# Patient Record
Sex: Male | Born: 1970 | Race: White | Hispanic: No | Marital: Single | Smoking: Never smoker
Health system: Southern US, Community
[De-identification: ages and names within clinical notes are randomized; demographics above are authoritative.]

## PROBLEM LIST (undated history)

## (undated) DIAGNOSIS — L409 Psoriasis, unspecified: Secondary | ICD-10-CM

---

## 2009-10-23 ENCOUNTER — Emergency Department: Payer: Self-pay | Admitting: Emergency Medicine

## 2014-04-16 ENCOUNTER — Ambulatory Visit: Payer: Self-pay | Admitting: Urology

## 2014-04-16 LAB — BASIC METABOLIC PANEL
ANION GAP: 7 (ref 7–16)
BUN: 12 mg/dL (ref 7–18)
CALCIUM: 8.9 mg/dL (ref 8.5–10.1)
Chloride: 101 mmol/L (ref 98–107)
Co2: 27 mmol/L (ref 21–32)
Creatinine: 1.18 mg/dL (ref 0.60–1.30)
EGFR (Non-African Amer.): >60
Glucose: 101 mg/dL — ABNORMAL HIGH (ref 65–99)
Osmolality: 270 (ref 275–301)
POTASSIUM: 4 mmol/L (ref 3.5–5.1)
Sodium: 135 mmol/L — ABNORMAL LOW (ref 136–145)

## 2014-04-16 LAB — CBC
HCT: 49.4 % (ref 40.0–52.0)
HGB: 16.3 g/dL (ref 13.0–18.0)
MCH: 30.8 pg (ref 26.0–34.0)
MCHC: 33 g/dL (ref 32.0–36.0)
MCV: 93 fL (ref 80–100)
Platelet: 144 10*3/uL — ABNORMAL LOW (ref 150–440)
RBC: 5.3 10*6/uL (ref 4.40–5.90)
RDW: 13 % (ref 11.5–14.5)
WBC: 11 10*3/uL — AB (ref 3.8–10.6)

## 2014-04-16 LAB — URINALYSIS, COMPLETE
Bilirubin,UR: NEGATIVE
Blood: NEGATIVE
GLUCOSE, UR: NEGATIVE mg/dL (ref 0–75)
Leukocyte Esterase: NEGATIVE
Nitrite: NEGATIVE
PH: 5 (ref 4.5–8.0)
Protein: 100
RBC,UR: 4 /HPF (ref 0–5)
Specific Gravity: 1.028 (ref 1.003–1.030)
Squamous Epithelial: 1

## 2014-04-16 LAB — HEMOGLOBIN: HGB: 15.1 g/dL (ref 13.0–18.0)

## 2014-04-16 LAB — CK: CK, TOTAL: 145 U/L

## 2014-04-17 LAB — APTT: Activated PTT: 34 secs (ref 23.6–35.9)

## 2014-04-17 LAB — HEMATOCRIT
HCT: 43.3 % (ref 40.0–52.0)
HCT: 43.6 % (ref 40.0–52.0)

## 2014-04-17 LAB — BASIC METABOLIC PANEL
Anion Gap: 4 — ABNORMAL LOW (ref 7–16)
BUN: 14 mg/dL (ref 7–18)
CALCIUM: 8.5 mg/dL (ref 8.5–10.1)
CREATININE: 1.26 mg/dL (ref 0.60–1.30)
Chloride: 104 mmol/L (ref 98–107)
Co2: 27 mmol/L (ref 21–32)
Glucose: 111 mg/dL — ABNORMAL HIGH (ref 65–99)
OSMOLALITY: 271 (ref 275–301)
POTASSIUM: 3.7 mmol/L (ref 3.5–5.1)
Sodium: 135 mmol/L — ABNORMAL LOW (ref 136–145)

## 2014-04-17 LAB — PROTIME-INR
INR: 1.1
Prothrombin Time: 13.8 secs (ref 11.5–14.7)

## 2014-04-17 LAB — CBC WITH DIFFERENTIAL/PLATELET
BASOS ABS: 0 10*3/uL (ref 0.0–0.1)
BASOS PCT: 0.3 %
Eosinophil #: 0.1 10*3/uL (ref 0.0–0.7)
Eosinophil %: 0.9 %
HCT: 43.5 % (ref 40.0–52.0)
HGB: 14.7 g/dL (ref 13.0–18.0)
LYMPHS ABS: 0.9 10*3/uL — AB (ref 1.0–3.6)
Lymphocyte %: 9.6 %
MCH: 31.1 pg (ref 26.0–34.0)
MCHC: 33.8 g/dL (ref 32.0–36.0)
MCV: 92 fL (ref 80–100)
Monocyte #: 1.1 x10 3/mm — ABNORMAL HIGH (ref 0.2–1.0)
Monocyte %: 11.4 %
NEUTROS ABS: 7.5 10*3/uL — AB (ref 1.4–6.5)
Neutrophil %: 77.8 %
Platelet: 126 10*3/uL — ABNORMAL LOW (ref 150–440)
RBC: 4.72 10*6/uL (ref 4.40–5.90)
RDW: 12.8 % (ref 11.5–14.5)
WBC: 9.7 10*3/uL (ref 3.8–10.6)

## 2014-04-17 LAB — PLATELET COUNT: Platelet: 133 10*3/uL — ABNORMAL LOW (ref 150–440)

## 2014-04-18 LAB — PLATELET FUNCTION ASSAY
COL/ADP PLT FXN SCRN: 138 Seconds — ABNORMAL HIGH (ref 0–100)
COL/ADP PLT FXN SCRN: 112 Seconds — ABNORMAL HIGH (ref 0–100)
COL/EPI PLT FXN SCRN: 108 s
COL/EPI PLT FXN SCRN: 101 Seconds

## 2014-04-18 LAB — CBC WITH DIFFERENTIAL/PLATELET
BASOS PCT: 0.7 %
Basophil #: 0.1 10*3/uL (ref 0.0–0.1)
EOS ABS: 0.1 10*3/uL (ref 0.0–0.7)
Eosinophil %: 0.7 %
HCT: 41.4 % (ref 40.0–52.0)
HGB: 14.3 g/dL (ref 13.0–18.0)
LYMPHS ABS: 0.9 10*3/uL — AB (ref 1.0–3.6)
Lymphocyte %: 8.9 %
MCH: 31.4 pg (ref 26.0–34.0)
MCHC: 34.5 g/dL (ref 32.0–36.0)
MCV: 91 fL (ref 80–100)
MONOS PCT: 11.1 %
Monocyte #: 1.1 x10 3/mm — ABNORMAL HIGH (ref 0.2–1.0)
NEUTROS ABS: 7.5 10*3/uL — AB (ref 1.4–6.5)
NEUTROS PCT: 78.6 %
PLATELETS: 144 10*3/uL — AB (ref 150–440)
RBC: 4.55 10*6/uL (ref 4.40–5.90)
RDW: 12.8 % (ref 11.5–14.5)
WBC: 9.6 10*3/uL (ref 3.8–10.6)

## 2014-04-18 LAB — CREATININE, SERUM
Creatinine: 1.45 mg/dL — ABNORMAL HIGH (ref 0.60–1.30)
Creatinine: 1.16 mg/dL (ref 0.60–1.30)
EGFR (African American): >60
EGFR (Non-African Amer.): 59 — ABNORMAL LOW
EGFR (Non-African Amer.): >60

## 2014-04-18 LAB — HEMATOCRIT
HCT: 40 % (ref 40.0–52.0)
HCT: 37.7 % — ABNORMAL LOW (ref 40.0–52.0)

## 2014-04-18 LAB — RAPID HIV SCREEN (HIV 1/2 AB+AG)

## 2014-04-18 LAB — PLATELET COUNT: Platelet: 135 10*3/uL — ABNORMAL LOW (ref 150–440)

## 2014-04-18 LAB — APTT: ACTIVATED PTT: 37.1 s — AB (ref 23.6–35.9)

## 2014-04-19 ENCOUNTER — Inpatient Hospital Stay: Payer: Self-pay | Admitting: Internal Medicine

## 2014-04-19 ENCOUNTER — Encounter: Payer: Self-pay | Admitting: Internal Medicine

## 2014-04-19 ENCOUNTER — Inpatient Hospital Stay (HOSPITAL_COMMUNITY)
Admission: RE | Admit: 2014-04-19 | Payer: Self-pay | Source: Other Acute Inpatient Hospital | Admitting: Internal Medicine

## 2014-04-19 LAB — CBC WITH DIFFERENTIAL/PLATELET
BASOS ABS: 0.1 10*3/uL (ref 0.0–0.1)
BASOS ABS: 0 10*3/uL (ref 0.0–0.1)
BASOS PCT: 0.8 %
Basophil %: 0.5 %
EOS ABS: 0.1 10*3/uL (ref 0.0–0.7)
EOS PCT: 2.3 %
Eosinophil #: 0.2 10*3/uL (ref 0.0–0.7)
Eosinophil %: 0.8 %
HCT: 36.5 % — ABNORMAL LOW (ref 40.0–52.0)
HCT: 36.2 % — ABNORMAL LOW (ref 40.0–52.0)
HGB: 12.6 g/dL — ABNORMAL LOW (ref 13.0–18.0)
HGB: 12.5 g/dL — ABNORMAL LOW (ref 13.0–18.0)
LYMPHS ABS: 1.1 10*3/uL (ref 1.0–3.6)
LYMPHS PCT: 4.5 %
LYMPHS PCT: 15.5 %
Lymphocyte #: 0.4 10*3/uL — ABNORMAL LOW (ref 1.0–3.6)
MCH: 31.8 pg (ref 26.0–34.0)
MCH: 31.6 pg (ref 26.0–34.0)
MCHC: 34.6 g/dL (ref 32.0–36.0)
MCHC: 34.6 g/dL (ref 32.0–36.0)
MCV: 92 fL (ref 80–100)
MCV: 91 fL (ref 80–100)
MONO ABS: 0.6 x10 3/mm (ref 0.2–1.0)
MONO ABS: 0.7 x10 3/mm (ref 0.2–1.0)
Monocyte %: 7.8 %
Monocyte %: 10.4 %
NEUTROS PCT: 86.1 %
Neutrophil #: 7.1 10*3/uL — ABNORMAL HIGH (ref 1.4–6.5)
Neutrophil #: 4.9 10*3/uL (ref 1.4–6.5)
Neutrophil %: 71.3 %
Platelet: 132 10*3/uL — ABNORMAL LOW (ref 150–440)
Platelet: 140 10*3/uL — ABNORMAL LOW (ref 150–440)
RBC: 3.97 10*6/uL — ABNORMAL LOW (ref 4.40–5.90)
RBC: 3.97 10*6/uL — ABNORMAL LOW (ref 4.40–5.90)
RDW: 12.9 % (ref 11.5–14.5)
RDW: 12.7 % (ref 11.5–14.5)
WBC: 8.3 10*3/uL (ref 3.8–10.6)
WBC: 6.9 10*3/uL (ref 3.8–10.6)

## 2014-04-19 LAB — CREATININE, SERUM
CREATININE: 0.95 mg/dL (ref 0.60–1.30)
Creatinine: 1.01 mg/dL (ref 0.60–1.30)
EGFR (African American): >60
EGFR (Non-African Amer.): >60

## 2014-04-19 LAB — PROTIME-INR
INR: 1.2
INR: 1.1
Prothrombin Time: 14.9 secs — ABNORMAL HIGH (ref 11.5–14.7)
Prothrombin Time: 13.7 secs (ref 11.5–14.7)

## 2014-04-19 LAB — BASIC METABOLIC PANEL
Anion Gap: 5 — ABNORMAL LOW (ref 7–16)
BUN: 13 mg/dL (ref 7–18)
CALCIUM: 8.4 mg/dL — AB (ref 8.5–10.1)
CHLORIDE: 99 mmol/L (ref 98–107)
CO2: 29 mmol/L (ref 21–32)
CREATININE: 1.2 mg/dL (ref 0.60–1.30)
EGFR (African American): >60
EGFR (Non-African Amer.): >60
Glucose: 116 mg/dL — ABNORMAL HIGH (ref 65–99)
OSMOLALITY: 267 (ref 275–301)
Potassium: 4 mmol/L (ref 3.5–5.1)
SODIUM: 133 mmol/L — AB (ref 136–145)

## 2014-04-19 LAB — APTT
ACTIVATED PTT: 36.6 s — AB (ref 23.6–35.9)
ACTIVATED PTT: 33.2 s (ref 23.6–35.9)
Activated PTT: 39.4 secs — ABNORMAL HIGH (ref 23.6–35.9)
Activated PTT: 43.4 secs — ABNORMAL HIGH (ref 23.6–35.9)

## 2014-04-19 LAB — PLATELET FUNCTION ASSAY
COL/ADP PLT FXN SCRN: 170 s — AB (ref 0–100)
COL/EPI PLT FXN SCRN: 170 Seconds — ABNORMAL HIGH

## 2014-04-19 LAB — HEMOGLOBIN
HGB: 12.2 g/dL — AB (ref 13.0–18.0)
HGB: 12.3 g/dL — AB (ref 13.0–18.0)

## 2014-04-19 NOTE — Progress Notes (Unsigned)
Patient ID: Joe FlurryKevin D Oliver, male   DOB: [birth year], 43 y.o.   MRN: [id number]  Request from [facility] Dr. Allena KatzPatel [facility] Pt with renal cyst, hematoma, persistent spontaneous bleeding. Hematologist there consulted and thought pt has VW disease. Pt give one dose of DDAVP, Repeat CT abd with worsening, expanding hematoma. Hematologist there [facility] and close observation. Pt's Hg 12.5 this AM.   Debbora PrestoMAGICK-Janet Humphreys, MD  [facility] Pager [phone] Cell [phone]  If 7PM-7AM, please contact night-coverage [email or web address] Password [email or web address]

## 2014-04-20 ENCOUNTER — Encounter (HOSPITAL_COMMUNITY): Payer: Self-pay | Admitting: *Deleted

## 2014-04-20 ENCOUNTER — Inpatient Hospital Stay (HOSPITAL_COMMUNITY)
Admission: RE | Admit: 2014-04-20 | Discharge: 2014-04-22 | DRG: 395 | Disposition: A | Discharge status: Home / Self Care | Payer: 59 | Source: Other Acute Inpatient Hospital | Attending: Internal Medicine | Admitting: Internal Medicine

## 2014-04-20 DIAGNOSIS — Z8249 Family history of ischemic heart disease and other diseases of the circulatory system: Secondary | ICD-10-CM | POA: Diagnosis not present

## 2014-04-20 DIAGNOSIS — Z79899 Other long term (current) drug therapy: Secondary | ICD-10-CM

## 2014-04-20 DIAGNOSIS — K661 Hemoperitoneum: Principal | ICD-10-CM | POA: Diagnosis present

## 2014-04-20 DIAGNOSIS — R109 Unspecified abdominal pain: Secondary | ICD-10-CM | POA: Diagnosis present

## 2014-04-20 DIAGNOSIS — S37092A Other injury of left kidney, initial encounter: Secondary | ICD-10-CM | POA: Insufficient documentation

## 2014-04-20 DIAGNOSIS — K683 Retroperitoneal hematoma: Secondary | ICD-10-CM

## 2014-04-20 DIAGNOSIS — R58 Hemorrhage, not elsewhere classified: Secondary | ICD-10-CM

## 2014-04-20 DIAGNOSIS — Z885 Allergy status to narcotic agent status: Secondary | ICD-10-CM | POA: Diagnosis not present

## 2014-04-20 DIAGNOSIS — Z823 Family history of stroke: Secondary | ICD-10-CM

## 2014-04-20 DIAGNOSIS — S37092S Other injury of left kidney, sequela: Secondary | ICD-10-CM

## 2014-04-20 HISTORY — DX: Psoriasis, unspecified: L40.9

## 2014-04-20 LAB — CBC
HEMATOCRIT: 34.2 % — AB (ref 39.0–52.0)
HEMOGLOBIN: 12.1 g/dL — AB (ref 13.0–17.0)
MCH: 30.9 pg (ref 26.0–34.0)
MCHC: 35.4 g/dL (ref 30.0–36.0)
MCV: 87.5 fL (ref 78.0–100.0)
Platelets: 173 10*3/uL (ref 150–400)
RBC: 3.91 MIL/uL — AB (ref 4.22–5.81)
RDW: 12 % (ref 11.5–15.5)
WBC: 4.6 10*3/uL (ref 4.0–10.5)

## 2014-04-20 LAB — CBC WITH DIFFERENTIAL/PLATELET
BASOS ABS: 0 10*3/uL (ref 0.0–0.1)
BASOS PCT: 0.6 %
EOS ABS: 0.2 10*3/uL (ref 0.0–0.7)
Eosinophil %: 2.6 %
HCT: 36.2 % — ABNORMAL LOW (ref 40.0–52.0)
HGB: 12.3 g/dL — ABNORMAL LOW (ref 13.0–18.0)
LYMPHS ABS: 0.8 10*3/uL — AB (ref 1.0–3.6)
Lymphocyte %: 12.5 %
MCH: 31.2 pg (ref 26.0–34.0)
MCHC: 33.8 g/dL (ref 32.0–36.0)
MCV: 93 fL (ref 80–100)
MONO ABS: 0.6 x10 3/mm (ref 0.2–1.0)
MONOS PCT: 9.4 %
NEUTROS PCT: 74.9 %
Neutrophil #: 4.6 10*3/uL (ref 1.4–6.5)
Platelet: 156 10*3/uL (ref 150–440)
RBC: 3.92 10*6/uL — AB (ref 4.40–5.90)
RDW: 12.7 % (ref 11.5–14.5)
WBC: 6.1 10*3/uL (ref 3.8–10.6)

## 2014-04-20 LAB — COMPREHENSIVE METABOLIC PANEL
ALBUMIN: 2.9 g/dL — AB (ref 3.5–5.2)
ALK PHOS: 73 U/L (ref 39–117)
ALT: 26 U/L (ref 0–53)
AST: 25 U/L (ref 0–37)
Anion gap: 10 (ref 5–15)
BUN: 10 mg/dL (ref 6–23)
CHLORIDE: 94 meq/L — AB (ref 96–112)
CO2: 30 mEq/L (ref 19–32)
Calcium: 9.1 mg/dL (ref 8.4–10.5)
Creatinine, Ser: 0.97 mg/dL (ref 0.50–1.35)
GFR calc Af Amer: >90 mL/min (ref >90)
GFR calc non Af Amer: >90 mL/min (ref >90)
Glucose, Bld: 86 mg/dL (ref 70–99)
POTASSIUM: 4 meq/L (ref 3.7–5.3)
SODIUM: 134 meq/L — AB (ref 137–147)
Total Bilirubin: 0.5 mg/dL (ref 0.3–1.2)
Total Protein: 7 g/dL (ref 6.0–8.3)

## 2014-04-20 LAB — APTT
ACTIVATED PTT: 40.2 s — AB (ref 23.6–35.9)
APTT: 40 s — AB (ref 24–37)

## 2014-04-20 LAB — PLATELET FUNCTION ASSAY
COL/ADP PLT FXN SCRN: 138 Seconds — ABNORMAL HIGH (ref 0–100)
COL/EPI PLT FXN SCRN: 90 s

## 2014-04-20 LAB — PROTIME-INR
INR: 1.11 (ref 0.00–1.49)
PROTHROMBIN TIME: 14.3 s (ref 11.6–15.2)

## 2014-04-20 MED ORDER — BISACODYL 5 MG PO TBEC: 5.0000 mg | DELAYED_RELEASE_TABLET | Freq: Every day | ORAL | Status: DC | PRN

## 2014-04-20 MED ORDER — DOCUSATE SODIUM 100 MG PO CAPS
100.0000 mg | ORAL_CAPSULE | Freq: Two times a day (BID) | ORAL | Status: DC
  Administered 2014-04-21: 100 mg via ORAL
  Filled 2014-04-20 (×5): qty 1

## 2014-04-20 MED ORDER — ONDANSETRON HCL 4 MG PO TABS: 4.0000 mg | ORAL_TABLET | Freq: Four times a day (QID) | ORAL | Status: DC | PRN

## 2014-04-20 MED ORDER — ADULT MULTIVITAMIN W/MINERALS CH
1.0000 | ORAL_TABLET | Freq: Every day | ORAL | Status: DC
  Administered 2014-04-21 – 2014-04-22 (×2): 1 via ORAL
  Filled 2014-04-20 (×2): qty 1

## 2014-04-20 MED ORDER — ZOLPIDEM TARTRATE 5 MG PO TABS
5.0000 mg | ORAL_TABLET | Freq: Every evening | ORAL | Status: DC | PRN
  Administered 2014-04-21: 5 mg via ORAL
  Filled 2014-04-20: qty 1

## 2014-04-20 MED ORDER — ACETAMINOPHEN 650 MG RE SUPP: 650.0000 mg | Freq: Four times a day (QID) | RECTAL | Status: DC | PRN

## 2014-04-20 MED ORDER — ACETAMINOPHEN 325 MG PO TABS
650.0000 mg | ORAL_TABLET | Freq: Four times a day (QID) | ORAL | Status: DC | PRN
  Administered 2014-04-21: 650 mg via ORAL
  Filled 2014-04-20: qty 2

## 2014-04-20 MED ORDER — MORPHINE SULFATE 2 MG/ML IJ SOLN
1.0000 mg | INTRAMUSCULAR | Status: DC | PRN
  Administered 2014-04-21: 1 mg via INTRAVENOUS
  Filled 2014-04-20: qty 1

## 2014-04-20 MED ORDER — VITAMIN B-1 100 MG PO TABS
100.0000 mg | ORAL_TABLET | Freq: Every day | ORAL | Status: DC
  Administered 2014-04-21 – 2014-04-22 (×2): 100 mg via ORAL
  Filled 2014-04-20 (×2): qty 1

## 2014-04-20 MED ORDER — DEXTROSE-NACL 5-0.9 % IV SOLN
INTRAVENOUS | Status: DC
  Administered 2014-04-20 – 2014-04-21 (×2): 1000 mL via INTRAVENOUS

## 2014-04-20 MED ORDER — ONDANSETRON HCL 4 MG/2ML IJ SOLN: 4.0000 mg | Freq: Four times a day (QID) | INTRAMUSCULAR | Status: DC | PRN

## 2014-04-20 MED ORDER — FOLIC ACID 1 MG PO TABS
1.0000 mg | ORAL_TABLET | Freq: Every day | ORAL | Status: DC
  Administered 2014-04-21 – 2014-04-22 (×2): 1 mg via ORAL
  Filled 2014-04-20 (×2): qty 1

## 2014-04-20 NOTE — H&P (Addendum)
[facility] History and Physical  Joe FlurryKevin D Steinkamp ZOX:[id number] DOB: 10/19/1970 DOA: 04/20/2014  Referring physician: [facility] PCP: No PCP Per Patient   Chief Complaint: Hematoma  HPI: Joe Oliver is a 43 y.o. male with no past history [facility] a left pernephric hematoma. He had originally presented with complaints of pain in th left flank. Patient states that he had no hematuria. He has had no trauma to the back. He had a CT scan of the abdomen and this was visualized at that time. Urology was consulted as well as hematology. Patient was also noted to have some cystic changes noted on the CT scan. Hematology felt that he may have vonWillebrands disease and suggested transfer to a tertiary care center for further workup.   Review of Systems:  Constitutional:  No weight loss, night sweats, Fevers, chills, fatigue.  HEENT:  No headaches, Difficulty swallowing,Tooth/dental problems,Sore throat Cardio-vascular:  No chest pain, Orthopnea, PND, swelling in lower extremities, anasarca, dizziness GI:  No heartburn, indigestion, abdominal pain, nausea, vomiting, diarrhea, change in bowel habits, loss of appetite  Resp:  No shortness of breath with exertion or at rest. No excess mucus, no productive cough, No non-productive cough, No coughing up of blood.No change in color of mucus.No wheezing. Skin:  no rash or lesions.  GU:  no dysuria, change in color of urine, no urgency or frequency. ++flank pain.  Musculoskeletal:  No joint pain or swelling. No decreased range of motion. ++back pain.  Psych:  No change in mood or affect. No depression or anxiety. No memory loss.   No past medical history on file. No past surgical history on file. Social History:  has no tobacco, alcohol, and drug history on file.  Allergies  Allergen Reactions  . Percocet [Oxycodone-Acetaminophen] Other (See Comments)    Affects breathing and puts "weight" on his chest     No family history on file.   Prior to Admission medications   Medication Sig Start Date End Date Taking? Authorizing Provider  acetaminophen (TYLENOL) 325 MG tablet Take 650 mg by mouth every 6 (six) hours as needed for mild pain.   Yes Historical Provider, MD  oxyCODONE-acetaminophen (PERCOCET/ROXICET) 5-325 MG per tablet Take 1 tablet by mouth every 6 (six) hours as needed for severe pain.   Yes Historical Provider, MD   Physical Exam: Filed Vitals:   04/20/14 2130  BP: 136/83  Pulse: 79  Temp: 97.9 F (36.6 C)  TempSrc: Oral  Resp: 16  Height: 6\' 1"  (1.854 m)  Weight: 65.772 kg (145 lb)  SpO2: 99%    Wt Readings from Last 3 Encounters:  04/20/14 65.772 kg (145 lb)    General:  Appears calm and comfortable Eyes: PERRL, normal lids, irises & conjunctiva ENT: grossly normal hearing, lips & tongue Neck: no LAD, masses or thyromegaly Cardiovascular: RRR, no m/r/g. No LE edema. Respiratory: CTA bilaterally, no w/r/r. Normal respiratory effort. Abdomen: soft, ntnd left flank tenderness Skin: no rash or induration seen on limited exam Musculoskeletal: grossly normal tone BUE/BLE Psychiatric: grossly normal mood and affect, speech fluent and appropriate Neurologic: grossly non-focal.          Labs on Admission:  Basic Metabolic Panel: No results found for this basename: NA, K, CL, CO2, GLUCOSE, BUN, CREATININE, CALCIUM, MG, PHOS,  in the last 168 hours Liver Function Tests: No results found for this basename: AST, ALT, ALKPHOS, BILITOT, PROT, ALBUMIN,  in the last 168 hours No  results found for this basename: LIPASE, AMYLASE,  in the last 168 hours No results found for this basename: AMMONIA,  in the last 168 hours CBC: No results found for this basename: WBC, NEUTROABS, HGB, HCT, MCV, PLT,  in the last 168 hours Cardiac Enzymes: No results found for this basename: CKTOTAL, CKMB, CKMBINDEX, TROPONINI,  in the last 168 hours  BNP (last 3 results) No results found  for this basename: PROBNP,  in the last 8760 hours CBG: No results found for this basename: GLUCAP,  in the last 168 hours  Radiological Exams on Admission: No results found.   Assessment/Plan Principal Problem:   Nontraumatic retroperitoneal hematoma Active Problems:   Left flank pain   Retroperitoneal hematoma   1. Retroperitoneal Perinephric Hematoma on the left side -will be admitted for further workup -will get a urology consult -hematology consult -monitor h/h  2. 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 -will get a hematology consult  -will order appropriate diagnostic testing -initial workup at [facility] was normal for vonWillebrands disease -I spoke with hematology on call and patient should be seen by them in the AM    Code Status: Full Code (must indicate code status--if unknown or must be presumed, indicate so) DVT Prophylaxis:SCDs Family Communication: None (indicate person spoken with, if applicable, with phone number if by telephone) Disposition Plan: Home (indicate anticipated LOS)  Time spent:  [facility] Pager [phone]  **Disclaimer: This note may have been dictated with voice recognition software. Similar sounding words can inadvertently be transcribed and this note may contain transcription errors which may not have been corrected upon publication of note.**

## 2014-04-21 ENCOUNTER — Encounter (HOSPITAL_COMMUNITY): Payer: Self-pay | Admitting: *Deleted

## 2014-04-21 LAB — COMPREHENSIVE METABOLIC PANEL
ALT: 23 U/L (ref 0–53)
ANION GAP: 12 (ref 5–15)
AST: 22 U/L (ref 0–37)
Albumin: 2.8 g/dL — ABNORMAL LOW (ref 3.5–5.2)
Alkaline Phosphatase: 69 U/L (ref 39–117)
BILIRUBIN TOTAL: 0.6 mg/dL (ref 0.3–1.2)
BUN: 8 mg/dL (ref 6–23)
CO2: 27 meq/L (ref 19–32)
CREATININE: 0.97 mg/dL (ref 0.50–1.35)
Calcium: 8.9 mg/dL (ref 8.4–10.5)
Chloride: 96 mEq/L (ref 96–112)
GFR calc non Af Amer: >90 mL/min (ref >90)
GLUCOSE: 115 mg/dL — AB (ref 70–99)
Potassium: 3.8 mEq/L (ref 3.7–5.3)
Sodium: 135 mEq/L — ABNORMAL LOW (ref 137–147)
Total Protein: 6.8 g/dL (ref 6.0–8.3)

## 2014-04-21 LAB — CBC
HCT: 35.4 % — ABNORMAL LOW (ref 39.0–52.0)
Hemoglobin: 12.3 g/dL — ABNORMAL LOW (ref 13.0–17.0)
MCH: 30.6 pg (ref 26.0–34.0)
MCHC: 34.7 g/dL (ref 30.0–36.0)
MCV: 88.1 fL (ref 78.0–100.0)
PLATELETS: 168 10*3/uL (ref 150–400)
RBC: 4.02 MIL/uL — ABNORMAL LOW (ref 4.22–5.81)
RDW: 12.1 % (ref 11.5–15.5)
WBC: 4.7 10*3/uL (ref 4.0–10.5)

## 2014-04-21 LAB — PROTIME-INR
INR: 1.11 (ref 0.00–1.49)
PROTHROMBIN TIME: 14.3 s (ref 11.6–15.2)

## 2014-04-21 LAB — APTT: aPTT: 38 seconds — ABNORMAL HIGH (ref 24–37)

## 2014-04-21 MED ORDER — HYDROMORPHONE HCL PF 1 MG/ML IJ SOLN: 1.0000 mg | INTRAMUSCULAR | Status: DC | PRN

## 2014-04-21 MED ORDER — MORPHINE SULFATE 2 MG/ML IJ SOLN: 1.0000 mg | INTRAMUSCULAR | Status: DC | PRN

## 2014-04-21 NOTE — Plan of Care (Signed)
Problem: Phase III Progression Outcomes Goal: Activity at appropriate level-compared to baseline (UP IN CHAIR FOR HEMODIALYSIS)  Outcome: Completed/Met Date Met:  04/21/14 Ambulated in hallway on 7/24

## 2014-04-21 NOTE — Progress Notes (Signed)
Nutrition Brief Note  Patient identified on the Malnutrition Screening Tool (MST) Report  Wt Readings from Last 15 Encounters:  04/20/14 145 lb (65.772 kg)    Body mass index is 19.13 kg/(m^2). Patient meets criteria for Normal weight based on current BMI.   Current diet order is NPO. Labs and medications reviewed.  Pt reported slight decrease in appetite for past one week d/t pain and nausea-induced by pain medications; however this was improving during admit and was eager for diet advancement. Denied significant weight loss, maintains usual body weight around 145-147 lbs. Encouraged to contact RD for additional nutrition related questions or concers   No nutrition interventions warranted at this time. If nutrition issues arise, please consult RD.   Joe HugerSarah F Keyatta Tolles MS RD LDN Clinical Dietitian Pager:[phone]

## 2014-04-21 NOTE — Plan of Care (Signed)
Problem: Phase III Progression Outcomes Goal: Pain controlled on oral analgesia Outcome: Progressing Mild pain in back 1-2/10 per patient

## 2014-04-21 NOTE — Consult Note (Signed)
Urology Consult  Referring physician:  Dr. Elisabeth Pigeon Reason for referral: evaluation of perinephric hematoma History of Present Illness: HPI: Joe Oliver is a 43 y.o.single [place]  Male, with no significant  past history, who is transferred  from [facility] for evalutaion of a left pernephric hematoma.    He noted  left flank pain Saturday, and went to the [facility] ED Sunday with L flank pain and pain level of 10/10. .N gross  Hematuria,  no trauma to the back.  Patient was also noted to have some cystic changes noted on the CT scan. Hematology felt that he may have vonWillebrands disease and suggested transfer to a tertiary care center for further workup.  Since his initial evaluation, his pain has decreased to 1/10.    Past Medical History  Diagnosis Date  . Psoriasis    History reviewed. No pertinent past surgical history.  Medications:  Pain med only  Allergies:  Allergies  Allergen Reactions  . Percocet [Oxycodone-Acetaminophen] Other (See Comments)    Affects breathing and puts "weight" on his chest    Family History  Problem Relation Age of Onset  . Hypertension Mother   . Hypertension Father   . Stroke Brother     Social History:  reports that he has never smoked. He does not have any smokeless tobacco history on file. He reports that he drinks alcohol. He reports that he does not use illicit drugs.  ROS :  Review of Systems:  Constitutional:  No weight loss, night sweats, Fevers, chills, fatigue.  HEENT:  No headaches, Difficulty swallowing,Tooth/dental problems,Sore throat  Cardio-vascular:  No chest pain, Orthopnea, PND, swelling in lower extremities, anasarca, dizziness  GI:  No heartburn, indigestion, abdominal pain, nausea, vomiting, diarrhea, change in bowel habits, loss of appetite  Resp:  No shortness of breath with exertion or at rest. No excess mucus, no productive cough, No non-productive cough, No coughing up of blood.No change in color of mucus.No wheezing.   Skin:  no rash or lesions.  GU:  no dysuria, change in color of urine, no urgency or frequency. ++flank pain.  Musculoskeletal:  No joint pain or swelling. No decreased range of motion. ++back pain.  Psych:  No change in mood or affect. No depression or anxiety. No memory loss.      Physical Exam:  Vital signs in last 24 hours: Temp:  [97.9 F (36.6 C)-98.3 F (36.8 C)] 98.3 F (36.8 C) (07/24 0510) Pulse Rate:  [71-79] 71 (07/24 0510) Resp:  [16] 16 (07/24 0510) BP: (118-136)/(63-83) 118/63 mmHg (07/24 0510) SpO2:  [99 %] 99 % (07/24 0510) Weight:  [65.772 kg (145 lb)] 65.772 kg (145 lb) (07/23 2130) Physical Exam  Laboratory Data:  Results for orders placed during the hospital encounter of 04/20/14 (from the past 72 hour(s))  CBC     Status: Abnormal   Collection Time    04/20/14 10:35 PM      Result Value Ref Range   WBC 4.6  4.0 - 10.5 K/uL   RBC 3.91 (*) 4.22 - 5.81 MIL/uL   Hemoglobin 12.1 (*) 13.0 - 17.0 g/dL   HCT 65.4 (*) 61.2 - 43.2 %   MCV 87.5  78.0 - 100.0 fL   MCH 30.9  26.0 - 34.0 pg   MCHC 35.4  30.0 - 36.0 g/dL   RDW 75.5  62.3 - 92.1 %   Platelets 173  150 - 400 K/uL  APTT     Status: Abnormal   Collection  Time    04/20/14 10:35 PM      Result Value Ref Range   aPTT 40 (*) 24 - 37 seconds   Comment:            IF BASELINE aPTT IS ELEVATED,     SUGGEST PATIENT RISK ASSESSMENT     BE USED TO DETERMINE APPROPRIATE     ANTICOAGULANT THERAPY.  PROTIME-INR     Status: None   Collection Time    04/20/14 10:35 PM      Result Value Ref Range   Prothrombin Time 14.3  11.6 - 15.2 seconds   INR 1.11  0.00 - 1.49  COMPREHENSIVE METABOLIC PANEL     Status: Abnormal   Collection Time    04/20/14 10:35 PM      Result Value Ref Range   Sodium 134 (*) 137 - 147 mEq/L   Potassium 4.0  3.7 - 5.3 mEq/L   Chloride 94 (*) 96 - 112 mEq/L   CO2 30  19 - 32 mEq/L   Glucose, Bld 86  70 - 99 mg/dL   BUN 10  6 - 23 mg/dL   Creatinine, Ser 0.97  0.50 - 1.35  mg/dL   Calcium 9.1  8.4 - 10.5 mg/dL   Total Protein 7.0  6.0 - 8.3 g/dL   Albumin 2.9 (*) 3.5 - 5.2 g/dL   AST 25  0 - 37 U/L   ALT 26  0 - 53 U/L   Alkaline Phosphatase 73  39 - 117 U/L   Total Bilirubin 0.5  0.3 - 1.2 mg/dL   GFR calc non Af Amer >90  >90 mL/min   GFR calc Af Amer >90  >90 mL/min   Comment: (NOTE)     The eGFR has been calculated using the CKD EPI equation.     This calculation has not been validated in all clinical situations.     eGFR's persistently <90 mL/min signify possible Chronic Kidney     Disease.   Anion gap 10  5 - 15  COMPREHENSIVE METABOLIC PANEL     Status: Abnormal   Collection Time    04/21/14  4:35 AM      Result Value Ref Range   Sodium 135 (*) 137 - 147 mEq/L   Potassium 3.8  3.7 - 5.3 mEq/L   Chloride 96  96 - 112 mEq/L   CO2 27  19 - 32 mEq/L   Glucose, Bld 115 (*) 70 - 99 mg/dL   BUN 8  6 - 23 mg/dL   Creatinine, Ser 0.97  0.50 - 1.35 mg/dL   Calcium 8.9  8.4 - 10.5 mg/dL   Total Protein 6.8  6.0 - 8.3 g/dL   Albumin 2.8 (*) 3.5 - 5.2 g/dL   AST 22  0 - 37 U/L   ALT 23  0 - 53 U/L   Alkaline Phosphatase 69  39 - 117 U/L   Total Bilirubin 0.6  0.3 - 1.2 mg/dL   GFR calc non Af Amer >90  >90 mL/min   GFR calc Af Amer >90  >90 mL/min   Comment: (NOTE)     The eGFR has been calculated using the CKD EPI equation.     This calculation has not been validated in all clinical situations.     eGFR's persistently <90 mL/min signify possible Chronic Kidney     Disease.   Anion gap 12  5 - 15  CBC     Status: Abnormal  Collection Time    04/21/14  4:35 AM      Result Value Ref Range   WBC 4.7  4.0 - 10.5 K/uL   RBC 4.02 (*) 4.22 - 5.81 MIL/uL   Hemoglobin 12.3 (*) 13.0 - 17.0 g/dL   HCT 35.4 (*) 39.0 - 52.0 %   MCV 88.1  78.0 - 100.0 fL   MCH 30.6  26.0 - 34.0 pg   MCHC 34.7  30.0 - 36.0 g/dL   RDW 12.1  11.5 - 15.5 %   Platelets 168  150 - 400 K/uL  APTT     Status: Abnormal   Collection Time    04/21/14  4:35 AM      Result  Value Ref Range   aPTT 38 (*) 24 - 37 seconds   Comment:            IF BASELINE aPTT IS ELEVATED,     SUGGEST PATIENT RISK ASSESSMENT     BE USED TO DETERMINE APPROPRIATE     ANTICOAGULANT THERAPY.  PROTIME-INR     Status: None   Collection Time    04/21/14  4:35 AM      Result Value Ref Range   Prothrombin Time 14.3  11.6 - 15.2 seconds   INR 1.11  0.00 - 1.49   No results found for this or any previous visit (from the past 240 hour(s)). Creatinine:  Recent Labs  04/20/14 2235 04/21/14 0435  CREATININE 0.97 0.97   Baseline Creatinine:   Impression/Assessment:  CT reviewed. L subcapsular hematoma. Not expanding. Will need f/u Hgb and will need f/y renal u/s until he resolves, then MRI kidneys.    Note increased PTT x 2   ? Reason.   Plan:  Follow Hgb/Hct for 24 hrs No surgery needed today, so he can eat 3. Will need F/u MRI renal, when his hematoma reabsorbs 4. Ck BP to be sure he doesn't develop hypertension.   Congetta Odriscoll I 04/21/2014, 11:52 AM

## 2014-04-21 NOTE — Progress Notes (Signed)
Patient ID: Joe Oliver, male   DOB: [birth year], 43 y.o.   MRN: [id number] [facility] PROGRESS NOTE  Joe Oliver [id number] DOB: [birth year] DOA: 04/20/2014 PCP: No PCP Per Patient  Brief narrative: 43 y.o. male with no significant past history who [facility] [place] for evaluation of left flank pain and left subcapsular hematoma.   Assessment/Plan:  Principal Problem:   Nontraumatic retroperitoneal hematoma / Left flank pain  Appreciate GU consult and recommendations  Pt is stable hemodynamically and his pain is controlled with currnet analgesia with dilaudid IV 1 mg every 2 hours PRN  Will need renal MRI once hematoma improves.  DVT prophylaxis: SCD's bilaterally due to risk of bleed  Code Status: full code  Family Communication: plan of care discussed with the patient Disposition Plan: home when stable   Joe PasseyEVINE, Joe Dolinger, MD  [facility] Pager [phone]  If 7PM-7AM, please contact night-coverage [email or web address] Password [email or web address] 04/21/2014, 1:53 PM   LOS: 1 day   Consultants:  GU     Procedures:  None   Antibiotics:  None   HPI/Subjective: No acute overnight events.  Objective: Filed Vitals:   04/20/14 2130 04/20/14 2255 04/21/14 0510  BP: 136/83  118/63  Pulse: 79 72 71  Temp: 97.9 F (36.6 C)  98.3 F (36.8 C)  TempSrc: Oral  Oral  Resp: 16  16  Height: 6\' 1"  (1.854 m)    Weight: 65.772 kg (145 lb)    SpO2: 99%  99%    Intake/Output Summary (Last 24 hours) at 04/21/14 1353 Last data filed at 04/21/14 0510  Gross per 24 hour  Intake    743 ml  Output    400 ml  Net    343 ml    Exam:   General:  Pt is alert, follows commands appropriately, not in acute distress  Cardiovascular: Regular rate and rhythm, S1/S2, no murmurs  Respiratory: Clear to auscultation bilaterally, no wheezing, no crackles, no rhonchi  Abdomen: Some left flank pain, no abdominal tenderness, (+) BS  Extremities: No edema, pulses DP and PT  palpable bilaterally  Neuro: Grossly nonfocal  Data Reviewed: Basic Metabolic Panel:  Recent Labs Lab 04/20/14 2235 04/21/14 0435  NA 134* 135*  K 4.0 3.8  CL 94* 96  CO2 30 27  GLUCOSE 86 115*  BUN 10 8  CREATININE 0.97 0.97  CALCIUM 9.1 8.9   Liver Function Tests:  Recent Labs Lab 04/20/14 2235 04/21/14 0435  AST 25 22  ALT 26 23  ALKPHOS 73 69  BILITOT 0.5 0.6  PROT 7.0 6.8  ALBUMIN 2.9* 2.8*   No results found for this basename: LIPASE, AMYLASE,  in the last 168 hours No results found for this basename: AMMONIA,  in the last 168 hours CBC:  Recent Labs Lab 04/20/14 2235 04/21/14 0435  WBC 4.6 4.7  HGB 12.1* 12.3*  HCT 34.2* 35.4*  MCV 87.5 88.1  PLT 173 168   Cardiac Enzymes: No results found for this basename: CKTOTAL, CKMB, CKMBINDEX, TROPONINI,  in the last 168 hours BNP: No components found with this basename: POCBNP,  CBG: No results found for this basename: GLUCAP,  in the last 168 hours  No results found for this or any previous visit (from the past 240 hour(s)).   Studies: No results found.  Scheduled Meds: . docusate sodium  100 mg Oral BID  . folic acid  1 mg Oral Daily  . multivitamin with minerals  1 tablet Oral Daily  .  thiamine  100 mg Oral Daily   Continuous Infusions: . dextrose 5 % and 0.9% NaCl 1,000 mL (04/20/14 2255)

## 2014-04-22 LAB — RENAL FUNCTION PANEL
Albumin: 2.6 g/dL — ABNORMAL LOW (ref 3.5–5.2)
Anion gap: 10 (ref 5–15)
BUN: 6 mg/dL (ref 6–23)
CALCIUM: 8.9 mg/dL (ref 8.4–10.5)
CO2: 29 mEq/L (ref 19–32)
Chloride: 99 mEq/L (ref 96–112)
Creatinine, Ser: 0.95 mg/dL (ref 0.50–1.35)
GFR calc Af Amer: >90 mL/min (ref >90)
Glucose, Bld: 106 mg/dL — ABNORMAL HIGH (ref 70–99)
PHOSPHORUS: 3.4 mg/dL (ref 2.3–4.6)
Potassium: 4.1 mEq/L (ref 3.7–5.3)
SODIUM: 138 meq/L (ref 137–147)

## 2014-04-22 LAB — HEMOGLOBIN AND HEMATOCRIT, BLOOD
HEMATOCRIT: 34.1 % — AB (ref 39.0–52.0)
HEMOGLOBIN: 11.8 g/dL — AB (ref 13.0–17.0)

## 2014-04-22 MED ORDER — HYDROCODONE-ACETAMINOPHEN 5-325 MG PO TABS: 1.0000 | ORAL_TABLET | Freq: Four times a day (QID) | ORAL | Status: AC | PRN

## 2014-04-22 NOTE — Discharge Instructions (Signed)

## 2014-04-22 NOTE — Consult Note (Signed)
  Subjective: The patient reports  Feeling well. Low pain level today. No Hgb yet today. BP slightly elevated for pt's age and body build.   Objective: Vital signs in last 24 hours: Temp:  [97.4 F (36.3 C)-98.8 F (37.1 C)] 98.4 F (36.9 C) (07/25 0522) Pulse Rate:  [69-77] 69 (07/25 0522) Resp:  [16-18] 18 (07/25 0522) BP: (131-141)/(75-80) 138/80 mmHg (07/25 0522) SpO2:  [99 %-100 %] 99 % (07/25 0522)A  Intake/Output from previous day: 07/24 0701 - 07/25 0700 In: 2193.8 [P.O.:960; I.V.:1233.8] Out: -  Intake/Output this shift:    Past Medical History  Diagnosis Date  . Psoriasis     Physical Exam:  Lungs - Normal respiratory effort, chest expands symmetrically.  Abdomen - Soft, non-tender & non-distended. Flank: normal. No CVA pain.   Lab Results:  Recent Labs  04/20/14 2235 04/21/14 0435  WBC 4.6 4.7  HGB 12.1* 12.3*  HCT 34.2* 35.4*   BMET  Recent Labs  04/20/14 2235 04/21/14 0435  NA 134* 135*  K 4.0 3.8  CL 94* 96  CO2 30 27  GLUCOSE 86 115*  BUN 10 8  CREATININE 0.97 0.97  CALCIUM 9.1 8.9   No results found for this basename: LABURIN,  in the last 72 hours No results found for this or any previous visit.  Studies/Results: No results found.  Assessment: Stable this AM. Pt desires discharge, but needs: 1. Hgb/Hct this AM Plan: 1. Follow up in 2-3 weeks with me  For hgb/hct and Cr/gfr; and  CT, renal, to evaluate his hematoma, and his BP 2. Pt to take his BP at home and record. 3. No work until he is released through his follow-up 4. OK for d/c this AM per Dr. Elisabeth Pigeonevine.  Christinamarie Tall I 04/22/2014, 8:47 AM

## 2014-04-22 NOTE — Progress Notes (Signed)
Patient discharged to home with family, discharge instructions reviewed with patient who verbalized understanding, new RX given to patient.

## 2014-04-22 NOTE — Discharge Summary (Signed)
Physician Discharge Summary  Joe FlurryKevin D Wire ZOX:[id number] DOB: [birth year] DOA: 04/20/2014  PCP: No PCP Per Patient  Admit date: 04/20/2014 Discharge date: 04/22/2014  Recommendations for Outpatient Follow-up:  1. Hemoglobin is stable prior to discharge. Pt will follow up with GU in 2-3 weeks. Needs further work up (MRI) after hematoma resolves.   Discharge Diagnoses:  Principal Problem:   Nontraumatic retroperitoneal hematoma Active Problems:   Left flank pain   Retroperitoneal hematoma    Discharge Condition: stable   Diet recommendation: as tolerated   History of present illness:  43 y.o. male with no significant past history who [facility] for evaluation of left flank pain and left subcapsular hematoma.   Assessment/Plan:   Principal Problem:  Nontraumatic retroperitoneal hematoma / Left flank pain  Appreciate GU consult and recommendations. Hematoma should ideally resolve prior to furhter work up. He needs to follow up in 2-3 weeks with GI for repeat CT.  DVT prophylaxis: SCD's bilaterally due to risk of bleed while in hospital  Code Status: full code  Family Communication: plan of care discussed with the patient    Consultants:  GU  Procedures:  None  Antibiotics:  None    Signed:  Manson PasseyEVINE, Charlett Merkle, MD  [facility] 04/22/2014, 9:07 AM  Pager #: [phone]   Discharge Exam: Filed Vitals:   04/22/14 0522  BP: 138/80  Pulse: 69  Temp: 98.4 F (36.9 C)  Resp: 18   Filed Vitals:   04/21/14 1409 04/21/14 2114 04/21/14 2140 04/22/14 0522  BP: 131/75 141/77  138/80  Pulse: 77 77 76 69  Temp: 97.4 F (36.3 C) 98.8 F (37.1 C)  98.4 F (36.9 C)  TempSrc: Oral Oral  Oral  Resp: 16 16  18   Height:      Weight:      SpO2: 99% 100%  99%    General: Pt is alert, follows commands appropriately, not in acute distress Cardiovascular: Regular rate and rhythm, S1/S2 +, no murmurs Respiratory: Clear to auscultation bilaterally, no  wheezing, no crackles, no rhonchi Abdominal: Soft, non tender, non distended, bowel sounds +, no guarding Extremities: no edema, no cyanosis, pulses palpable bilaterally DP and PT Neuro: Grossly nonfocal  Discharge Instructions  Discharge Instructions   Call MD for:  difficulty breathing, headache or visual disturbances    Complete by:  As directed      Call MD for:  persistant dizziness or light-headedness    Complete by:  As directed      Call MD for:  persistant nausea and vomiting    Complete by:  As directed      Call MD for:  severe uncontrolled pain    Complete by:  As directed      Diet - low sodium heart healthy    Complete by:  As directed      Discharge instructions    Complete by:  As directed   Please follow up with urology per scheduled apt. Please abstain from work for about 1 month from discharge.     Increase activity slowly    Complete by:  As directed             Medication List    STOP taking these medications       oxyCODONE-acetaminophen 5-325 MG per tablet  Commonly known as:  PERCOCET/ROXICET      TAKE these medications       acetaminophen 325 MG tablet  Commonly known as:  TYLENOL  Take 650 mg by mouth every 6 (six) hours as needed for mild pain.     HYDROcodone-acetaminophen 5-325 MG per tablet  Commonly known as:  NORCO  Take 1 tablet by mouth every 6 (six) hours as needed for moderate pain.          The results of significant diagnostics from this hospitalization (including imaging, microbiology, ancillary and laboratory) are listed below for reference.    Significant Diagnostic Studies: No results found.  Microbiology: No results found for this or any previous visit (from the past 240 hour(s)).   Labs: Basic Metabolic Panel:  Recent Labs Lab 04/20/14 2235 04/21/14 0435  NA 134* 135*  K 4.0 3.8  CL 94* 96  CO2 30 27  GLUCOSE 86 115*  BUN 10 8  CREATININE 0.97 0.97  CALCIUM 9.1 8.9   Liver Function Tests:  Recent  Labs Lab 04/20/14 2235 04/21/14 0435  AST 25 22  ALT 26 23  ALKPHOS 73 69  BILITOT 0.5 0.6  PROT 7.0 6.8  ALBUMIN 2.9* 2.8*   No results found for this basename: LIPASE, AMYLASE,  in the last 168 hours No results found for this basename: AMMONIA,  in the last 168 hours CBC:  Recent Labs Lab 04/20/14 2235 04/21/14 0435  WBC 4.6 4.7  HGB 12.1* 12.3*  HCT 34.2* 35.4*  MCV 87.5 88.1  PLT 173 168   Cardiac Enzymes: No results found for this basename: CKTOTAL, CKMB, CKMBINDEX, TROPONINI,  in the last 168 hours BNP: BNP (last 3 results) No results found for this basename: PROBNP,  in the last 8760 hours CBG: No results found for this basename: GLUCAP,  in the last 168 hours  Time coordinating discharge: Over 30 minutes

## 2015-01-20 NOTE — H&P (Signed)
PATIENT NAME:  Joe Oliver, Joe Oliver MR#:  [id number] DATE OF BIRTH:  January 19, 1971  DATE OF ADMISSION:  04/16/2014  REFERRING PHYSICIAN: Darien Ramusavid W. Kaminski, MD  FAMILY PHYSICIAN: Nonlocal.   REASON FOR ADMISSION: Left perinephric hematoma.   HISTORY OF PRESENT ILLNESS: The patient is a 44 year old male with no significant past medical history who presents to the Emergency Room with a 2-day history of worsening back pain. Denies trauma. In the Emergency Room, the patient was noted to have a left perinephric hematoma. No clear reason for the hematoma. His hemoglobin is stable. Denies hematuria. Now admitted for further evaluation.   PAST MEDICAL HISTORY:  Unremarkable.   MEDICATIONS: None.   ALLERGIES: No known drug allergies.   SOCIAL HISTORY: Negative for alcohol or tobacco abuse.   FAMILY HISTORY: Positive for hypertension but otherwise unremarkable.   REVIEW OF SYSTEMS:    CONSTITUTIONAL: No fever or change in weight.  EYES: No blurred or double vision. No glaucoma.  ENT: No tinnitus or hearing loss. No nasal discharge or bleeding. No difficulty swallowing.  RESPIRATORY: No cough or wheezing. Denies hemoptysis.  CARDIOVASCULAR: No chest pain or orthopnea. No palpitations or syncope.  GASTROINTESTINAL: No nausea, vomiting or diarrhea. No change in bowel habits.  GENITOURINARY: No dysuria or hematuria. No incontinence.  ENDOCRINE: No polyuria or polydipsia. No heat or cold intolerance.  HEMATOLOGIC: The patient denies anemia, easy bruising or bleeding.  LYMPHATIC: No swollen glands.  MUSCULOSKELETAL: The patient denies pain in his neck, shoulders, knees or hips. No gout.  NEUROLOGIC: No numbness or migraines. Denies stroke or seizures.  PSYCHOLOGICAL: The patient denies anxiety, insomnia or depression.   PHYSICAL EXAMINATION: GENERAL: The patient is in no acute distress.  VITAL SIGNS: Remarkable for a blood pressure of 131/84, heart rate of 67, respiratory rate of 16, temperature 97.9,  sats 97% on room air.  HEENT: Normocephalic, atraumatic. Pupils equally round and reactive to light and accommodation. Extraocular movements are intact. Sclerae are anicteric. Conjunctivae are clear. Oropharynx clear.  NECK: Supple without JVD or bruits. No adenopathy or thyromegaly is noted.  LUNGS: Clear to auscultation and percussion without wheezes, rales or rhonchi. No dullness. Respiratory effort is normal.  CARDIAC: Regular rate and rhythm with normal S1, S2. No significant rubs, murmurs or gallops. PMI is nondisplaced. Chest wall is nontender.  ABDOMEN: Soft, nontender, with normoactive bowel sounds. No organomegaly or masses were appreciated. Left CVA tenderness was noted. No organomegaly or masses were appreciated. No hernias or bruits were noted.  EXTREMITIES: Without clubbing, cyanosis or edema. Pulses were 2+ bilaterally.  SKIN: Warm and dry without rash or lesions.  NEUROLOGIC: Cranial nerves II through XII grossly intact. Deep tendon reflexes were symmetric. Motor and sensory exams nonfocal.  PSYCHIATRIC: Revealed a patient who was alert and oriented to person, place and time. He was cooperative and used good judgment.   LABORATORY, DIAGNOSTIC AND RADIOLOGICAL DATA:  CT of the abdomen and pelvis revealed a 6 x 7 cm left perinephric hematoma. No hydronephrosis was present. Urinalysis was negative for hematuria. His white count was 11.0 with a hemoglobin of 16.3. Glucose was 101 with a BUN of 12, creatinine 1.18 with a GFR of greater than 60.   ASSESSMENT: 1.  Back/flank pain.  2.  Left perinephric hematoma of unclear etiology.  3.  Hyponatremia.   PLAN: The patient will be observed on the floor with serial hemoglobin and hematocrit.  We will consult urology. Follow up routine labs in the morning. We will use morphine  as needed for pain control. Further treatment and evaluation will depend upon the patient's progress.   TOTAL TIME SPENT ON THIS PATIENT: 45 minutes.     ____________________________ Duane Lope Judithann Sheen, MD jds:cs Oliver: 04/16/2014 19:23:45 ET T: 04/16/2014 20:27:24 ET JOB#: [id number]  cc: Duane Lope. Judithann Sheen, MD, <Dictator> Hildreth Orsak Rodena Medin MD ELECTRONICALLY SIGNED 04/17/2014 7:58

## 2015-01-20 NOTE — Consult Note (Signed)
Chief Complaint:  Subjective/Chief Complaint NO ACUTE COMPLAINTS SOME SIDE AND BACK DISCOMFORT BETTER TONIGHT, HAS HAD HEADACHE, WAS NAUSEATED EARLIER   VITAL SIGNS/ANCILLARY NOTES: **Vital Signs.:   22-Jul-15 20:20  Vital Signs Type Routine  Temperature Temperature (F) 97.8  Celsius 36.5  Temperature Source oral  Pulse Pulse 64  Respirations Respirations 18  Systolic BP Systolic BP 829  Diastolic BP (mmHg) Diastolic BP (mmHg) 79  Mean BP 96  Pulse Ox % Pulse Ox % 98  Pulse Ox Activity Level  At rest  Oxygen Delivery Room Air/ 21 %   Brief Assessment:  GEN well developed   Cardiac Regular   Respiratory normal resp effort   Additional Physical Exam ALERT AND COOPERATIVE NEURO NON FOCAL   Lab Results:  Routine Chem:  22-Jul-15 20:52   Creatinine (comp) 0.95  eGFR (African American) >60  eGFR (Non-African American) >60 (eGFR values <38m/min/1.73 m2 may be an indication of chronic kidney disease (CKD). Calculated eGFR is useful in patients with stable renal function. The eGFR calculation will not be reliable in acutely ill patients when serum creatinine is changing rapidly. It is not useful in  patients on dialysis. The eGFR calculation may not be applicable to patients at the low and high extremes of body sizes, pregnant women, and vegetarians.)  Routine Coag:  22-Jul-15 15:49   Activated PTT (APTT)  43.4 (A HCT value >55% may artifactually increase the APTT. In one study, the increase was an average of 19%. Reference: "Effect on Routine and Special Coagulation Testing Values of Citrate Anticoagulant Adjustment in Patients with High HCT Values." American Journal of Clinical Pathology 2006;126:400-405.)  Prothrombin 13.7  INR 1.1 (INR reference interval applies to patients on anticoagulant therapy. A single INR therapeutic range for coumarins is not optimal for all indications; however, the suggested range for most indications is 2.0 - 3.0. Exceptions to the INR  Reference Range may include: Prosthetic heart valves, acute myocardial infarction, prevention of myocardial infarction, and combinations of aspirin and anticoagulant. The need for a higher or lower target INR must be assessed individually. Reference: The Pharmacology and Management of the Vitamin K  antagonists: the seventh ACCP Conference on Antithrombotic and Thrombolytic Therapy. CFAOZH.0865Sept:126 (3suppl): [id number] A HCT value >55% may artifactually increase the PT.  In one study,  the increase was an average of 25%. Reference:  "Effect on Routine and Special Coagulation Testing Values of Citrate Anticoagulant Adjustment in Patients with High HCT Values." American Journal of Clinical Pathology 2006;126:400-405.)    17:21   Col/EPI  170 (0-150 Prolonged closure time for  the col/EPI screen test may  be seen in the following: Platelet dysfunction that is acquired, inherited, or  induced by platelet inhibiting agents, such as aspirin; HCT <35%, and  Platelet count <150,000. Suggest clinical correlation.)  Col/ADP  170 (Result(s) reported on 19 Apr 2014 at 06:37PM.)    20:52   Activated PTT (APTT) 33.2 (A HCT value >55% may artifactually increase the APTT. In one study, the increase was an average of 19%. Reference: "Effect on Routine and Special Coagulation Testing Values of Citrate Anticoagulant Adjustment in Patients with High HCT Values." American Journal of Clinical Pathology 2006;126:400-405.)  Routine Hem:  22-Jul-15 15:49   Hemoglobin (CBC)  12.3 (Result(s) reported on 19 Apr 2014 at 04:08PM.)    20:52   WBC (CBC) 6.9  RBC (CBC)  3.97  Hemoglobin (CBC)  12.5  Hematocrit (CBC)  36.2  Platelet Count (CBC)  140  MCV 91  MCH 31.6  MCHC 34.6  RDW 12.7  Neutrophil % 71.3  Lymphocyte % 15.5  Monocyte % 10.4  Eosinophil % 2.3  Basophil % 0.5  Neutrophil # 4.9  Lymphocyte # 1.1  Monocyte # 0.7  Eosinophil # 0.2  Basophil # 0.0 (Result(s) reported on 19 Apr 2014  at 10:03PM.)   Radiology Results:  CT:    21-Jul-15 23:46, CT Abdomen Without Contrast  CT Abdomen Without Contrast   REASON FOR EXAM:    F/O PERINEPHRIC HEMATOMA, R/O PROGRESSION  COMMENTS:       PROCEDURE: CT  - CT ABDOMEN STANDARD WO  - Apr 18 2014 11:46PM     CLINICAL DATA:  Perinephric hematoma.  Evaluate for progression.    EXAM:  CT ABDOMEN WITHOUT CONTRAST    TECHNIQUE:  Multidetector CT imaging of the abdomen was performed following the  standard protocol without IV contrast.    COMPARISON:  Prior examinations 04/16/2014 and 04/17/2014.  FINDINGS:  There is a new small left pleural effusion with associated left  lower lobe atelectasis. There is no significant right pleural or  pericardial effusion.    The previously demonstrated complex posterior left perinephric and  subcapsular hematoma appears larger than on the baseline  examination, now measuring approximately 8.0 x 4.2 cm transverse on  image 19. The lesion appears more complex with increased areas of  high density consistent with interval bleeding. There is also  increased inferior extension of blood within the perinephric space.  There is increased compression distortion of the left kidney.  Multiple underlying hyperdense left renal lesions are again noted.    The right kidney has a stable appearance without surrounding blood.  There is a tiny hyperdense lesion in its lower pole. Theliver,  spleen, gallbladder, pancreas and adrenal glands appear normal.  There is no evidence of bowel obstruction.     IMPRESSION:  1. Enlarging left posterior perinephric and subcapsular hematoma  with increased distortion of the left kidney. Left untreated, these  findings may progress to Page kidney.  2. Multiple underlying hyperdense left renal lesions are again  noted. Again, findings are probably secondary to a ruptured cyst.  Follow-up MRI may be helpful to exclude an underlying solid lesion.  3. New small left  pleural effusion with mild left lower lobe  atelectasis.  4. These results were called by telephone at the time of  interpretation on 04/19/2014 at 12:30 am to Dr. Barbette Reichmann , who  verbally acknowledged these results.      Electronically Signed    By: Camie Patience M.D.    On: 04/19/2014 00:31         Verified By: Vivia Ewing, M.D.,   Assessment/Plan:  Assessment/Plan:  Assessment POSSIBLE BLEEDING DIATHESIS. VWD SUSPECTED INITIALLY BUT RESULTS AVAILABLE TONIGHT, FACTOR VIII FUNCTIONAL 157%,, FACTOR VIII ANTIGEN 246% AND RISTOCETIN COFACTOR OVER 100% ARE NORMAL. PLT FUNCTION TESTING ABNORMAL, SOME INCREASE IN RESULTS FROM INITIAL. SOME WAX AND WANE RESULTS BUT INR LAST NORMAL, PTT HAD INCREASED, BUT LAST IS NORMAL AT 33. DISCUSSED RESULTS WITH [facility]. ADVISING PATIENT PLT TRANSFUSION, FOR UNDIAGNOSED CAUSE OF APPARENT PLT DYSFUNCTION. PATIENT REFUSED. CLINICALLY , NO NEW SYMPTOMS, MORE COMFORTABLE, VSS, AND HGB STABLE. CRE NORMAL.  CONSIDER TIMING OF REPEAT CT SCAN, WOULD NOT NRMALY SCAN DAILY, BUT LAST SCAN WAS NOT STABLE, AND CLINICAL INTERVENTION AVAILABLE/TRANSFUSE PLTS IF ANY CHANGES. WILL RECONSULT UROLOGY. RE TRANSFER, PATIENT AND FAMILY DISCUSSED, DECLINED TONIGHT   Electronic Signatures: Dallas Schimke (MD)  (Signed  22-Jul-15 23:02)  Authored: Chief Complaint, VITAL SIGNS/ANCILLARY NOTES, Brief Assessment, Lab Results, Radiology Results, Assessment/Plan   Last Updated: 22-Jul-15 23:02 by Dallas Schimke (MD)

## 2015-01-20 NOTE — Consult Note (Signed)
Chief Complaint:  Subjective/Chief Complaint Hematocrit and BP stable   VITAL SIGNS/ANCILLARY NOTES: **Vital Signs.:   21-Jul-15 00:02  Systolic BP Systolic BP 138  Diastolic BP (mmHg) Diastolic BP (mmHg) 79    05:17  Systolic BP Systolic BP 134  Diastolic BP (mmHg) Diastolic BP (mmHg) 82   Assessment/Plan:  Assessment/Plan:  Assessment Renal hematoma   Plan Stable from surgical standpoint and can be discharged, Bedrest for 2 weeks and followup in the office in 2 weeks.   Electronic Signatures: Orson ApeWolff, Michael R (MD)  (Signed 21-Jul-15 09:01)  Authored: Chief Complaint, VITAL SIGNS/ANCILLARY NOTES, Assessment/Plan   Last Updated: 21-Jul-15 09:01 by Orson ApeWolff, Michael R (MD)

## 2015-01-20 NOTE — Discharge Summary (Signed)
PATIENT NAME:  Joe FlurryDAVIS, Ridge D MR#:  [id number] DATE OF BIRTH:  1971/03/01  DATE OF ADMISSION:  04/19/2014 DATE OF DISCHARGE:  04/20/2014  ADDENDUM  For detailed discharge summary, please refer to the dictation by Dr. Auburn BilberryShreyang Patel. The [facility] was [facility]. [facility] by Dr. Lorre NickGittin with concern about the patient's abnormal platelet dysfunction and concerns of bleeding. However, the patient's platelets became normal yesterday, and also repeated a CAT scan of the kidney showed stable hematoma. No further expansion. Dr. Lorre NickGittin discussed with the hospitalist in [facility]. The [facility] was [facility] yesterday.    ____________________________ Shaune PollackQing Renda Pohlman, MD qc:jr D: 04/21/2014 13:40:14 ET T: 04/21/2014 14:36:08 ET JOB#: [id number]  cc: Shaune PollackQing Dericka Ostenson, MD, <Dictator> Shaune PollackQING Farhad Burleson MD ELECTRONICALLY SIGNED 04/21/2014 17:58

## 2015-01-20 NOTE — Consult Note (Signed)
Chief Complaint:  Subjective/Chief Complaint AS PER NURSING, MILD NAUSEA, NO BACK PAIN   Lab Results: Routine Micro:  21-Jul-15 05:57   Micro Text Report HIV 1/2 AG AB COMBO   HIV 1/2 ANTIBODIES        NON-REACTIVE ANTIBODY   HIV-1 p24 ANTIGEN         NON-REACTIVE ANTIGEN   INTERPRETATION            NONREACTIVE.  A NONREACTIVE test result means that HIV-1 or HIV-2 antibodies and HIV-1 p24 antigen were not detected in the specimen.   ANTIBIOTIC                       Routine Chem:  21-Jul-15 14:08   Creatinine (comp)  1.45  eGFR (African American) >60  eGFR (Non-African American)  59 (eGFR values <8mL/min/1.73 m2 may be an indication of chronic kidney disease (CKD). Calculated eGFR is useful in patients with stable renal function. The eGFR calculation will not be reliable in acutely ill patients when serum creatinine is changing rapidly. It is not useful in  patients on dialysis. The eGFR calculation may not be applicable to patients at the low and high extremes of body sizes, pregnant women, and vegetarians.)    20:50   Creatinine (comp) 1.16  eGFR (African American) >60  eGFR (Non-African American) >60 (eGFR values <61mL/min/1.73 m2 may be an indication of chronic kidney disease (CKD). Calculated eGFR is useful in patients with stable renal function. The eGFR calculation will not be reliable in acutely ill patients when serum creatinine is changing rapidly. It is not useful in  patients on dialysis. The eGFR calculation may not be applicable to patients at the low and high extremes of body sizes, pregnant women, and vegetarians.)  Routine Sero:  21-Jul-15 05:57   - HIV 1/2 Antibodies NON-REACTIVE ANTIBODY  - HIV-1 p24 Antigen NON-REACTIVE ANTIGEN  - Interpretation NONREACTIVE.  A NONREACTIVE test result means that HIV-1 or HIV-2 antibodies and HIV-1 p24 antigen were not detected in the specimen.  Result(s) reported on 18 Apr 2014 at 12:19PM.  Routine Coag:  21-Jul-15  05:57   Col/EPI 108 (0-150 Prolonged closure time for  the col/EPI screen test may  be seen in the following: Platelet dysfunction that is acquired, inherited, or  induced by platelet inhibiting agents, such as aspirin; HCT <35%, and  Platelet count <150,000. Suggest clinical correlation.)  Col/ADP  138 (Result(s) reported on 18 Apr 2014 at 07:38AM.)    14:22   Activated PTT (APTT)  37.1 (A HCT value >55% may artifactually increase the APTT. In one study, the increase was an average of 19%. Reference: "Effect on Routine and Special Coagulation Testing Values of Citrate Anticoagulant Adjustment in Patients with High HCT Values." American Journal of Clinical Pathology 2006;126:400-405.)  Col/EPI 101 (0-150 Prolonged closure time for  the col/EPI screen test may  be seen in the following: Platelet dysfunction that is acquired, inherited, or  induced by platelet inhibiting agents, such as aspirin; HCT <35%, and  Platelet count <150,000. Suggest clinical correlation.)  Col/ADP  112 (Result(s) reported on 18 Apr 2014 at 04:07PM.)  Routine Hem:  21-Jul-15 05:57   Hematocrit (CBC) 40.0 (Result(s) reported on 18 Apr 2014 at 06:43AM.)  Platelet Count (CBC)  135 (Result(s) reported on 18 Apr 2014 at 06:44AM.)    14:08   Hematocrit (CBC) 41.4  WBC (CBC) 9.6  RBC (CBC) 4.55  Hemoglobin (CBC) 14.3  Platelet Count (CBC)  144  MCV 91  MCH 31.4  MCHC 34.5  RDW 12.8  Neutrophil % 78.6  Lymphocyte % 8.9  Monocyte % 11.1  Eosinophil % 0.7  Basophil % 0.7  Neutrophil #  7.5  Lymphocyte #  0.9  Monocyte #  1.1  Eosinophil # 0.1  Basophil # 0.1 (Result(s) reported on 18 Apr 2014 at 02:43PM.)    20:38   Hematocrit (CBC)  37.7 (Result(s) reported on 18 Apr 2014 at 09:16PM.)   Radiology Results: CT:    21-Jul-15 23:46, CT Abdomen Without Contrast  CT Abdomen Without Contrast   REASON FOR EXAM:    F/O PERINEPHRIC HEMATOMA, R/O PROGRESSION  COMMENTS:       PROCEDURE: CT  - CT  ABDOMEN STANDARD WO  - Apr 18 2014 11:46PM     CLINICAL DATA:  Perinephric hematoma.  Evaluate for progression.    EXAM:  CT ABDOMEN WITHOUT CONTRAST    TECHNIQUE:  Multidetector CT imaging of the abdomen was performed following the  standard protocol without IV contrast.    COMPARISON:  Prior examinations 04/16/2014 and 04/17/2014.  FINDINGS:  There is a new small left pleural effusion with associated left  lower lobe atelectasis. There is no significant right pleural or  pericardial effusion.    The previously demonstrated complex posterior left perinephric and  subcapsular hematoma appears larger than on the baseline  examination, now measuring approximately 8.0 x 4.2 cm transverse on  image 19. The lesion appears more complex with increased areas of  high density consistent with interval bleeding. There is also  increased inferior extension of blood within the perinephric space.  There is increased compression distortion of the left kidney.  Multiple underlying hyperdense left renal lesions are again noted.    The right kidney has a stable appearance without surrounding blood.  There is a tiny hyperdense lesion in its lower pole. Theliver,  spleen, gallbladder, pancreas and adrenal glands appear normal.  There is no evidence of bowel obstruction.     IMPRESSION:  1. Enlarging left posterior perinephric and subcapsular hematoma  with increased distortion of the left kidney. Left untreated, these  findings may progress to Page kidney.  2. Multiple underlying hyperdense left renal lesions are again  noted. Again, findings are probably secondary to a ruptured cyst.  Follow-up MRI may be helpful to exclude an underlying solid lesion.  3. New small left pleural effusion with mild left lower lobe  atelectasis.  4. These results were called by telephone at the time of  interpretation on 04/19/2014 at 12:30 am to Dr. Barbette Reichmann , who  verbally acknowledged these  results.      Electronically Signed    By: Camie Patience M.D.    On: 04/19/2014 00:31         Verified By: Vivia Ewing, M.D.,   Assessment/Plan:  Assessment/Plan:  Assessment SEE ALSO EARLIER NOTE... LAST EVENING, REPEAT HGB SLIGHTLY LOWER, ALTHOUGH HE HAD SOME HYDRATION, I ORDERED REPEAT CT. RADIOLOGY REPORTS SOME INCREASE FRM LAST SCAN, VITALS STABLE, NO NEW PAIN, SOME NAUSEA AND HEADACHE AS PER NURSING. REPEAT CRE WAS BETTER AFTER FLUID   Plan AS PRIOR NOTED, AS ADVISED/DISCUSSED WITH DR MA OF [facility]PT [facility], WILL GIVE DDAVP WITH EVIDENCE OF SOME PROGRESSION.  DOSE WRITTEN. ALSO CONSULTED HOSPITALIST TO FOLLOW PATIENT. CHANGED VITAL SIGNS TO Q 1 HR. DISUSSED WITH NURSING, WILL CALL MD IF ANY NEW COMPLAINTS/CHANGES/INCREASED PAIN OR UNSTABLE   Electronic Signatures: Dallas Schimke (MD)  (Signed [phone]  00:25)  Authored: Chief Complaint, Lab Results, Radiology Results, Assessment/Plan   Last Updated: 22-Jul-15 00:58 by Dallas Schimke (MD)

## 2015-01-20 NOTE — Discharge Summary (Signed)
PATIENT NAME:  Joe Oliver, Joe Oliver MR#:  [id number] DATE OF BIRTH:  12-02-1970  DATE OF ADMISSION:  04/16/2014 DATE OF ANTICIPATED TRANSFER:  04/19/2014  ADMITTING DIAGNOSIS:  Back pain in the left flank region.   DISCHARGE DIAGNOSES: 1.  Back pain due to a left perinephric hematoma.  2.  Left perinephritic hematoma with expansion, concern for possible von Willebrand disease, status post transfusion with DDAVP per hematology. Needs further evaluation and recommends transfer to a tertiary center.  3.  Mild acute blood loss anemia as a result of hemorrhage.  4.  Mild hyponatremia.   CONSULTANTS DURING HOSPITALIZATION: Dr. Evelene CroonWolff and Dr. Lorre NickGittin of hematology.   PERTINENT EVALUATIONS. CT of the abdomen and pelvis without contrast on July 19 showed large complex posterior left perinephric hematoma. Underlying left kidney demonstrates multiple complex hyperdense lesions, which are incompletely characterized without contrast. No evidence of hydronephrosis or urinary tract calculus. CT scan repeated on the 21st of July shows enlargement of the left posterior perinephric and subcapsular hematoma with increased distortion of the left kidney. Multiple underlying hyperdense left renal lesions again noted.  Admitting glucose 101, BUN 12, creatinine 1.18, sodium 135, potassium 4.0, chloride 101, CO2 of 27. Calcium was 8.9. WBC on admission 11.0, hemoglobin 16.3, platelet count was 144. Most recent hemoglobin is 12.2. INR 1.1, activated PTT on admission was 34, activated PTT on July 22 was 39.4. After DDAVP transfusion, 36.6. INR 1.2, PT 14.9. Hepatitis C was negative. HIV 1 and 2 nonreactive.   HOSPITAL COURSE: Please refer to H and P done by the admitting physician. The patient is a 44 year old white male who has no previous medical history, who presented with left-sided flank pain, worsening. The patient was seen in the ED and we were asked to admit the patient for observation. The patient had a CT, which showed a left  perinephritic hematoma. He was seen by urology, who recommended observation and monitoring. His did have a drop in his hemoglobin. The patient was kept in the hospital. His PTT was elevated; therefore, hematology consult was done. Hematology saw the patient and they felt that patient likely has von Willebrand disease. The confirming test would take 48 hours to do here. Also, there was a prolonged aggregation of  platelet study, all suggestive of von Willebrand disease. Therefore, he is recommended to transfer to a tertiary care center, which we are attempting to do at this point. The patient hemodynamically is stable.   DISCHARGE MEDICATIONS: Tylenol 650 q.4 p.r.n., morphine 2 to 4 mg IV q.2 p.r.n. for pain, Zofran 4 mg IV q.4 p.r.n., Protonix 40 mg p.o. b.i.Oliver., Nucynta 75 to 150 mg q.6 p.r.n. for pain.   TIME SPENT: 35 minutes.     ____________________________ Lacie ScottsShreyang H. Allena KatzPatel, MD shp:dmm Oliver: 04/19/2014 12:27:00 ET T: 04/19/2014 12:46:34 ET JOB#: [id number]  cc: Suttyn Cryder H. Allena KatzPatel, MD, <Dictator> Charise CarwinSHREYANG H Wai Litt MD ELECTRONICALLY SIGNED 04/29/2014 11:04

## 2015-01-20 NOTE — Consult Note (Signed)
Chief Complaint:  Subjective/Chief Complaint NO ACUTE COMPLAINTS, NO HEADACHE TODAY   VITAL SIGNS/ANCILLARY NOTES:  **Vital Signs.:   23-Jul-15 05:00  Vital Signs Type Routine  Temperature Temperature (F) 97.6  Celsius 36.4  Temperature Source oral  Pulse Pulse 63  Respirations Respirations 20  Systolic BP Systolic BP 528  Diastolic BP (mmHg) Diastolic BP (mmHg) 75  Mean BP 94  Pulse Ox % Pulse Ox % 98  Pulse Ox Activity Level  At rest  Oxygen Delivery Room Air/ 21 %    14:11  Vital Signs Type Routine  Temperature Temperature (F) 97.9  Celsius 36.6  Temperature Source oral  Pulse Pulse 75  Respirations Respirations 20  Systolic BP Systolic BP 413  Diastolic BP (mmHg) Diastolic BP (mmHg) 68  Mean BP 83  Pulse Ox % Pulse Ox % 98  Pulse Ox Activity Level  At rest  Oxygen Delivery Room Air/ 21 %   Brief Assessment:  GEN well developed   Respiratory normal resp effort    Lab Results:  Routine BB:  22-Jul-15 01:59   ABO Group + Rh Type O Positive  Antibody Screen NEGATIVE (Result(s) reported on 19 Apr 2014 at 06:59AM.)  Crossmatch Unit 1 Ready  Crossmatch Unit 2 Ready (Result(s) reported on 19 Apr 2014 at 06:59AM.)    04:08   Direct [place], Polyspecific NEGATIVE (Result(s) reported on 19 Apr 2014 at 02:12PM.)  General Ref:  22-Jul-15 08:38   APTT Mixing Studies ========== TEST NAME ==========  ========= RESULTS =========  = REFERENCE RANGE =  APTT MIXING STUDIES  Results called/faxed to Dolores Lory @ 2:45am 7.23.15 Fx   [phone] aPTT Mixing Studies aPTT                            [H  40.1 sec            ]         23.4-36.4 aPTT 1:1 Normal Plasma          [H  36.7 sec             ]         23.4-36.4 aPTT 1:1 Mix Saline             [   32.1 sec             ]          0.0-37.0 aPTT 1:1 NP Mix, 60 Min,Incub.  [H  39.6 sec             ]     23.4-36.4 aPTT 1:1 NP Incub. Mix Ctl      [H  39.5 sec             ]         23.4-36.4               [facility]            No: [id number]           [address], [place], [place] [address]           Lindon Romp, [id number]   Result(s) reported on 20 Apr 2014 at 05:21AM.    20:52   von Willebrand Factor Activity ========== TEST NAME ==========  ========= RESULTS =========  = REFERENCE RANGE =  von WILLEBRAND FAC.ACTVY  vWF Activity vWF Activity                    [  Result Pending       ]                                 [facility] [id number]           [address], [place], [place] [address]           Lindon Romp, MD         [phone]   Result(s) reported on 20 Apr 2014 at 01:10PM.  Factor IX Activity ========== TEST NAME ==========  ========= RESULTS =========  = REFERENCE RANGE =  FACTOR IX ACTIVITY  Factor IX Activity Factor IX Activity              [H  275 %                ]            55-150 Studies indicate high levels of factor IX arerisk factors for venous thrombosis.  Factor IX  levels can be elevated with use of estrogens and glucocorticosteriods.               [facility]            No: [id number]           [address] [place], [place] [address]           Lindon Romp, MD         [phone]   Result(s) reported on 20 Apr 2014 at 01:31PM.  Factor VIII Activity ========== TEST NAME ==========  ========= RESULTS =========  = REFERENCE RANGE =  FACTOR VIII ACTIVITY  Factor VIII Activity Factor VIII Activity            [H  224 %                ]            50-150 Factor VIII activity (FVIII), can increase transiently as an acute phase reactant protein in response to stress or exertion.  FVIII is also elevated with the use of oral contraceptives and during normal pregnancy.  Persistently elevated FVIII activity may have a genetic basis and is a risk factor for venous thrombosis as well as recurrence of venous thrombosis.  Risk is graded and increases with the degree of elevation.  Consideration should be  given to repeat analysis to determine if this abnormality is persistent.       [facility]            No: [id number]           [address], [place], [place] [address]           Lindon Romp, MD         [phone]   Result(s) reported on 20 Apr 2014 at 01:05PM.  Factor VIII Antigen ========== TEST NAME ==========  ========= RESULTS =========  = REFERENCE RANGE =  FACTOR VIII ANTIGEN  Factor VIII Antigen F VIII Antigen                  [   Result Pending       ]                                 [facility]  No: [id number]  [address] [place], [place] [address]           Lindon Romp, MD         [phone]   Result(s) reported on 20 Apr 2014 at 08:49AM.  Factor XI Activity ========== TEST NAME ==========  ========= RESULTS =========  = REFERENCE RANGE =  FACTOR XI ACTIVITY  Factor XI Activity Factor XI Activity              [H  150 %                ]            60-135 Studies indicate high levels of factor XI arerisk factors for venous thrombosis.               [facility]            No: [id number]           [address] [place], [place] [address]           Lindon Romp, MD         [phone]   Result(s) reported on 20 Apr 2014 at01:36PM.  Routine Chem:  22-Jul-15 01:59   Creatinine (comp) 1.20  eGFR (African American) >60  eGFR (Non-African American) >60 (eGFR values <52mL/min/1.73 m2 may be an indication of chronic kidney disease (CKD). Calculated eGFR is useful in patients with stable renal function. The eGFR calculation will not be reliable in acutely ill patients when serum creatinine is changing rapidly. It is not useful in  patients on dialysis. The eGFR calculation may not be applicable to patients at the low and high extremes of body sizes, pregnant women, and vegetarians.)  Glucose, Serum  116  BUN 13  Sodium, Serum  133  Potassium, Serum 4.0  Chloride, Serum 99  CO2, Serum 29  Calcium  (Total), Serum  8.4  Anion Gap  5  Osmolality (calc) 267    08:38   Creatinine (comp) 1.01  eGFR (African American) >60  eGFR (Non-African American) >60 (eGFR values <82mL/min/1.73 m2 may be an indication of chronic kidney disease (CKD). Calculated eGFR is useful in patients with stable renal function. The eGFR calculation will not be reliable in acutely ill patients when serum creatinine is changing rapidly. It is not useful in  patients on dialysis. The eGFR calculation may not be applicable to patients at the low and high extremes of body sizes, pregnant women, and vegetarians.)    20:52   Creatinine (comp) 0.95  eGFR (African American) >60  eGFR (Non-African American) >60 (eGFR values <43mL/min/1.73 m2 may be an indication of chronic kidney disease (CKD). Calculated eGFR is useful in patients with stable renal function. The eGFR calculation will not be reliable in acutely ill patients when serum creatinine is changing rapidly. It is not useful in  patients on dialysis. The eGFR calculation may not be applicable to patients at the low and high extremes of body sizes, pregnant women, and vegetarians.)  Routine Coag:  22-Jul-15 01:59   Activated PTT (APTT)  39.4 (A HCT value >55% may artifactually increase the APTT. In one study, the increase was an average of 19%. Reference: "Effect on Routine and Special Coagulation Testing Values of Citrate Anticoagulant Adjustment in Patients with High HCT Values." American Journal of Clinical Pathology 2006;126:400-405.)    08:38   Activated PTT (APTT)  36.6 (A HCT value >55% may artifactually increase the APTT. In one study, the increase was an average of  19%. Reference: "Effect on Routine and Special Coagulation Testing Values of Citrate Anticoagulant Adjustment in Patients with High HCT Values." American Journal of Clinical Pathology 2006;126:400-405.)  Prothrombin  14.9  INR 1.2 (INR reference interval applies to patients on  anticoagulant therapy. A single INR therapeutic range for coumarins is not optimal for all indications; however, the suggested range for most indications is 2.0 - 3.0. Exceptions to the INR Reference Range may include: Prosthetic heart valves, acute myocardial infarction, prevention of myocardial infarction, and combinations of aspirin and anticoagulant. The need for a higher or lower target INR must be assessed individually. Reference: The Pharmacology and Management of the Vitamin K  antagonists: the seventh ACCP Conference on Antithrombotic and Thrombolytic Therapy. DPOEU.2353 Sept:126 (3suppl): N9146842. A HCT value >55% may artifactually increase the PT.  In one study,  the increase was an average of 25%. Reference:  "Effect on Routine and Special Coagulation Testing Values of Citrate Anticoagulant Adjustment in Patients with High HCT Values." American Journal of Clinical Pathology 2006;126:400-405.)    15:49   Activated PTT (APTT)  43.4 (A HCT value >55% may artifactually increase the APTT. In one study, the increase was an average of 19%. Reference: "Effect on Routine and Special Coagulation Testing Values of Citrate Anticoagulant Adjustment in Patients with High HCT Values." American Journal of Clinical Pathology 2006;126:400-405.)  Prothrombin 13.7  INR 1.1 (INR reference interval applies to patients on anticoagulant therapy. A single INR therapeutic range for coumarins is not optimal for all indications; however, the suggested range for most indications is 2.0 - 3.0. Exceptions to the INR Reference Range may include: Prosthetic heart valves, acute myocardial infarction, prevention of myocardial infarction, and combinations of aspirin and anticoagulant. The need for a higher or lower target INR must be assessed individually. Reference: The Pharmacology and Management of the Vitamin K  antagonists: the seventh ACCP Conference on Antithrombotic and Thrombolytic Therapy.  IRWER.1540 Sept:126 (3suppl): N9146842. A HCT value >55% may artifactually increase the PT.  In one study,  the increase was an average of 25%. Reference:  "Effect on Routine and Special Coagulation Testing Values of Citrate Anticoagulant Adjustment in Patients with High HCT Values." American Journal of Clinical Pathology 2006;126:400-405.)    17:21   Col/EPI  170 (0-150 Prolonged closure time for  the col/EPI screen test may  be seen in the following: Platelet dysfunction that is acquired, inherited, or  induced by platelet inhibiting agents, such as aspirin; HCT <35%, and  Platelet count <150,000. Suggest clinical correlation.)  Col/ADP  170 (Result(s) reported on 19 Apr 2014 at 06:37PM.)    20:52   Activated PTT (APTT) 33.2 (A HCT value >55% may artifactually increase the APTT. In one study, the increase was an average of 19%. Reference: "Effect on Routine and Special Coagulation Testing Values of Citrate Anticoagulant Adjustment in Patients with High HCT Values." American Journal of Clinical Pathology 2006;126:400-405.)  23-Jul-15 05:42   Col/EPI 90 (0-150 Prolonged closure time for  the col/EPI screen test may  be seen in the following: Platelet dysfunction that is acquired, inherited, or  induced by platelet inhibiting agents, such as aspirin; HCT <35%, and  Platelet count <150,000. Suggest clinical correlation.)  Col/ADP  138 (Result(s) reported on 20 Apr 2014 at 07:01AM.)  Activated PTT (APTT)  40.2 (A HCT value >55% may artifactually increase the APTT. In one study, the increase was an average of 19%. Reference: "Effect on Routine and Special Coagulation Testing Values of Citrate Anticoagulant Adjustment in Patients with High  HCT Values." American Journal of Clinical Pathology 2956;213:086-578.)  Routine Hem:  22-Jul-15 01:59   WBC (CBC) 8.3  RBC (CBC)  3.97  Hemoglobin (CBC)  12.6  Hematocrit (CBC)  36.5  Platelet Count (CBC)  132  MCV 92  MCH 31.8  MCHC  34.6  RDW 12.9  Neutrophil % 86.1  Lymphocyte % 4.5  Monocyte % 7.8  Eosinophil % 0.8  Basophil % 0.8  Neutrophil #  7.1  Lymphocyte #  0.4  Monocyte # 0.6  Eosinophil # 0.1  Basophil # 0.1 (Result(s) reported on 19 Apr 2014 at 02:53AM.)    08:38   Hemoglobin (CBC)  12.2 (Result(s) reported on 19 Apr 2014 at 09:24AM.)    15:49   Hemoglobin (CBC)  12.3 (Result(s) reported on 19 Apr 2014 at 04:08PM.)    20:52   WBC (CBC) 6.9  RBC (CBC)  3.97  Hemoglobin (CBC)  12.5  Hematocrit (CBC)  36.2  Platelet Count (CBC)  140  MCV 91  MCH 31.6  MCHC 34.6  RDW 12.7  Neutrophil % 71.3  Lymphocyte % 15.5  Monocyte % 10.4  Eosinophil % 2.3  Basophil % 0.5  Neutrophil # 4.9  Lymphocyte # 1.1  Monocyte # 0.7  Eosinophil # 0.2  Basophil # 0.0 (Result(s) reported on 19 Apr 2014 at 10:03PM.)  23-Jul-15 05:42   WBC (CBC) 6.1  RBC (CBC)  3.92  Hemoglobin (CBC)  12.3  Hematocrit (CBC)  36.2  Platelet Count (CBC) 156  MCV 93  MCH 31.2  MCHC 33.8  RDW 12.7  Neutrophil % 74.9  Lymphocyte % 12.5  Monocyte % 9.4  Eosinophil % 2.6  Basophil % 0.6  Neutrophil # 4.6  Lymphocyte #  0.8  Monocyte # 0.6  Eosinophil # 0.2  Basophil # 0.0 (Result(s) reported on 20 Apr 2014 at 07:01AM.)   Radiology Results: CT:    22-Jul-15 23:34, CT Abdomen Without Contrast  CT Abdomen Without Contrast   REASON FOR EXAM:    F/U PERINEPHRIC HEMATOMA COMPARE TO LAST SCAN PLEASE   CALL REPORT DR Inez Pilgrim PGR 5  COMMENTS:       PROCEDURE: CT  - CT ABDOMEN STANDARD WO  - Apr 19 2014 11:34PM     CLINICAL DATA:  Follow-up left-sided perinephric hematoma.    EXAM:  CT ABDOMEN WITHOUT CONTRAST    TECHNIQUE:  Multidetector CT imaging of the abdomen was performed following the  standard protocol without IV contrast.  COMPARISON:  CT of the abdomen from 04/18/2014    FINDINGS:  The patient's left posterior perinephric and subcapsular hematoma is  grossly unchanged from the prior study. Surrounding blood  extending  inferiorly along the left paracolic gutter is also stable in  appearance. The appearance of the hematoma is slightly different  given the lack of contrast, but multiple apparent hemorrhagic cysts  are again seen at the superior aspect of the left kidney.    There is some degree of compression of the renal parenchyma, though  the anterior aspect of the left kidney appears largely intact. The  right kidney is unremarkable in appearance.    A trace reactive left-sided pleural effusion is also stable in  appearance.    The liver and spleen are unremarkable in appearance. Trace fluid  extends about the posterior aspect of the spleen. The gallbladder is  within normal limits. The pancreas and adrenal glands are  unremarkable.    No renal or ureteral stones are seen. There is no  evidence of  hydronephrosis.    Visualized small and large bowel loops are grossly unremarkable in  appearance. Noacute vascular abnormalities are seen. The stomach is  unremarkable in appearance.    No acute osseous abnormalities are seen.   IMPRESSION:  1. Complex left posterior perinephric and subcapsular hematoma is  stable in appearance, with stable surrounding blood extending  inferiorly along the left paracolic gutter. Some degree of  compression of the renal parenchyma again noted, though the anterior  aspect of the left kidney appears intact. Multiple apparent  hemorrhagic cysts again noted at the superior aspect of the left  kidney. As previously described, if left untreated, these findings  may progress to Page kidney.  2. As previously suggested, once the hematoma resolves, follow-up  MRI may be helpful to exclude an underlying solid lesion.  3. Stable appearance to trace reactive left-sided pleural effusion.    These results were called by telephone at the time of interpretation  on 04/20/2014 at 12:33 am to Dr. Barbette Reichmann, who verbally  acknowledged these  results.      Electronically Signed    By: Garald Balding M.D.    On: 04/20/2014 00:35         Verified By: JEFFREY . CHANG, M.D.,   Assessment/Plan:  Assessment/Plan:  Assessment SEE ALSO PRIOR NOTE     SUMMARY   1. UNDERLYING RENAL LESION, UNKNOWN IF BENIGN OF MALIGNANT, NEEDS F/U    2. CURRENT PERINEPHRIC HEMATOMA INITIALLY EXPANDED, BY CT LAST NIGHT WAS STABLE X 24 HRS  3. HGB STABLE LAST 36 HRS   4. NEPHROLOGY NEEDS TO FOLLOW ASAP, AND ADVISE RE IMAGING NEEDED AND IF ANY INTERVENTIONS NEEDED.  PLAN IS CURRENTLY TO TRANSFER DUE TO AVAILABLILITY OF UROLOGY . 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 A LUPUS STUDY IS PENDING. ALSO FACTOR IX AND XI ACTIVITY ARE HIGH.  PLATELET FUNCTION WITH ADP IS PROLONGED ON REPEAT TESTING, THERE IS A PLATELET FUNCTION ABNORMALITY THUS FAR UNDIAGNOSED.  EMPIRIC TX IF BLEEDING WOULD BE TO TRANSFUSE PLTS. NO TRANSFUSION HAS BEEN GIVEN AS PATIENT REFUSED LAST NIGHT. NOW EVIDENCE OF STABILITY, NOT AUTOMATICALLY INDICATED. MILD THROMBOCYTOPENIA NORMALIZED, TODAY PLTS 156K   Plan . NEEDS MEDICAL CENTER F/U FOR PLT DISFUNCTION WHERE ADDITIONAL STUDIES TO EVALUATE PLATELET DISORDERS IS AVAILABLE. NEEDS SERIAL CBC  NEEDS UROLOGY F/U, TO ADVISE AS PER IMAGING, AND POSSIBLE INTERVENTIONS, BUT IDEALLY SHOULD COMPLETE HEME W/U FOR PLATELET DYSFUNCTION BEFORE SURGICAL INTERVENTION. OTHERWISE PLT TRANSFUSION WOULD BE NEEDED TO SUPPORT INVASIVE STUDIES   Electronic Signatures: Dallas Schimke (MD)  (Signed 23-Jul-15 18:30)  Authored: Chief Complaint, VITAL SIGNS/ANCILLARY NOTES, Brief Assessment, Lab Results, Radiology Results, Assessment/Plan   Last Updated: 23-Jul-15 18:30 by  Dallas Schimke (MD)

## 2015-01-20 NOTE — Consult Note (Signed)
PATIENT NAME:  Joe Oliver, Joe Oliver#:  [id number] DATE OF BIRTH:  10/08/1970  DATE OF CONSULTATION:  04/17/2014  REFERRING PHYSICIAN:  Dr. Judithann SheenSparks CONSULTING PHYSICIAN:  Suszanne ConnersMichael R. Evelene CroonWolff, MD  REASON FOR CONSULTATION: Renal hematoma.   HISTORY OF PRESENT ILLNESS: Joe Oliver is a 44 year old white male with sudden onset of left flank pain on July 17th. He presented to the Emergency Room and CT scan was performed, which revealed a 5.9 x 3.2 x 7.7 cm left renal hematoma. No other specific abnormalities were noted. The patient was admitted for observation. Admitting studies included a BUN of 12 and creatinine of 1.18 and hematocrit of 49.4%. The patient denied gross hematuria. He also denied history of flank or abdominal trauma. He denied history of urinary tract infections or kidney stones.   ALLERGIES: No known drug allergies.   MEDICATIONS: None.  PAST SURGICAL HISTORY: No previous surgical procedures.   SOCIAL HISTORY: The patient denied tobacco or alcohol use.   FAMILY HISTORY: Negative for urologic or kidney disease.   PAST AND CURRENT MEDICAL CONDITIONS: Essentially negative.   REVIEW OF SYSTEMS: Negative.   PHYSICAL EXAMINATION: GENERAL: A thin white male in no acute distress.  ABDOMEN: Soft. He had mild left upper flank tenderness.   DIAGNOSTIC DATA: CT scan with and without contrast today confirmed that he did have a subcapsular left renal hematoma, which did not appear to be enlarging compared to prior study yesterday. It appeared to originate within a hemorrhagic cyst. The patient also appeared to have several other left renal cysts.   BUN was 14 and creatinine was 1.6 today. Hematocrit was 43.5% today.   Prothrombin time was normal at 13.8 and INR was also normal at 1.4 with a PTT of 34, which was also normal.   IMPRESSION:  1.  Spontaneous left renal hematoma.  2. Multiple left renal cysts.   SUGGESTIONS:  1.  Repeat hematocrit at 8 hours intervals through the night.  2.   Strict bed rest with bathroom privileges.   3.  If the hematocrit remains stable, suggest that the patient may be safely discharged home tomorrow, but will need to remain at bedrest for 2 weeks.  4.  Follow up in the office after discharge in 2 weeks. The patient will need repeat imaging in approximately 3 months to determine if there are any occult lesions that may have caused this bleed, such as a small hemangioma or possibly renal cell carcinoma.   ____________________________ Suszanne ConnersMichael R. Evelene CroonWolff, MD mrw:sb D: 04/17/2014 14:07:40 ET T: 04/17/2014 15:42:24 ET JOB#: [id number]  cc: Suszanne ConnersMichael R. Evelene CroonWolff, MD, <Dictator> Orson ApeMICHAEL R Anna Beaird MD ELECTRONICALLY SIGNED 04/18/2014 9:16

## 2015-01-20 NOTE — Consult Note (Signed)
Urology Consultation Report  Reason for Consultation: Left Renal/Perinephric Hematoma  Requesting MD: Aram BeechamJeffrey Sparks, M.D. Consulting MD: Marin OlpJay H. Jarielys Girardot, M.D.  HPI: 44 y.o. WM who was is his usual state of good health until 4 days ago when he noted the onset on mild (1/10), intermittent LLQ discomfort (dull ache).  The pain intensified (5-6/10) and became constant yesterday radiating to the left flank, worse with twisting at the waist and laying on the left side.  Today, at noon, the pain became acutely worse reaching a 10/10, and was associated with an episode of N/V.  [facility].  In  the ER, the pt was afeb with stable vital signs. A mild leukocystosis to 11k was present. Hct 49.4%, Plt 144k, Cr 1.18, Ca++ 8.9.  Microscopic hematuria was present on UA with 4 rbc/hpf (pH 5.0, SG 1.028).  Stone CT revealed a large, complex posterior hematoma (7.7cm cephalocaudad, 5.9 x 3.2cm transversely) with heterogenous areas of high density consistent with blood in the left kidney.  Multiple hyperdense lesions (2cm in the LUP, 1.8cm in the LLP) were also present. No fat, stones, or hydro appreciated.  The pain resolved with parenteral narcotics in the ER and has not recurred.  The denies any dysuria, gross hematuria, F/C, change in bowel habits, bleeding diathesis. The patient denies any h/o urolithiasis or FH Renal Tumors or Cystic Disease or Aneurysms.  The pt does not take any prescription or OTC medications (denies NSAID's) or use recreational drugs.  PMH: None PSH: None SH: Denies any past or current tobacco use; Only occas ETOH (no h/o DT's, blackouts, binge drinking) FH: Positive for Urolithiasis (Father); Neg for Renal Cell Carcinoma, Prostate Cancer  Meds: None Allergies: NKDA  ROS: as per the HPI, plus 13 system review as per the detailed H&P by Dr. Aram BeechamJeffrey Sparks dated 04/16/2014, confirmed with the patient.  Exam: T97.22F, BP 138/81, P 63 reg, RR 18 unlabored, RA  sat 98% Gen:  WDWN WM in NAD HEENT:  E. Lopez/AT, EOMI, Anicteric Neck:   no masses, no bruits Chest:  CTA, nL respiratory effort Cor:  RRR w/out M/G/R, 2+ Radial and Carotid Pulses b/L Abd:  NABS, NT/ND, no palpable masses/organomegaly; mild Left CVAT GU:  nL Circ Phallus without discharge/lesion; b/L descended testes - NT. no masses; nL epididymides b/L; Left Grade 2 Varicocele - NT Ext:  no edema, NT Neuro:  non-focal Psych:  A&O x4, pleasant/cooperative, appropriate Skin/Lymph:  Warm/Dry, no lesions about the Head/Neck; no Cervical, Supraclavicular, Inguinal Adenopathy  Labs/Imaging: as per the HPI  (CT reviewed personally)  Assessment:  Left Renal Hemorrhage with subcapsular/perinephric hematoma with multiple hyperdense cystic lesions -differential includes hemorrhagic cyst vs AML vs RCC vs AVM -currently, hemodynamically stable  Microscopic Hematuria -likely related to the above  Recommendations:  1. Agree with close hemodynamic observation with serial Hct determinations 2. R/O Coagulopathy 3. Renal Protocol CT in the AM  Dr. Anola GurneyMichael Wolff will be assuming care Monday morning.  Electronic Signatures: Marin OlpKim, Slyvester Latona H (MD)  (Signed on 19-Jul-15 22:53)  Authored  Last Updated: 19-Jul-15 22:53 by Marin OlpKim, Abril Cappiello H (MD)

## 2015-01-20 NOTE — Consult Note (Signed)
Chief Complaint:  Subjective/Chief Complaint NO PAIN NO ACUTE COMPLAINTS   VITAL SIGNS/ANCILLARY NOTES: **Vital Signs.:   21-Jul-15 05:17  Vital Signs Type Routine  Temperature Temperature (F) 98.9  Celsius 37.1  Temperature Source oral  Pulse Pulse 79  Respirations Respirations 20  Systolic BP Systolic BP 778  Diastolic BP (mmHg) Diastolic BP (mmHg) 82  Mean BP 99  Pulse Ox % Pulse Ox % 96  Pulse Ox Activity Level  At rest  Oxygen Delivery Room Air/ 21 %   Brief Assessment:  GEN well developed   Cardiac Regular   Respiratory normal resp effort   Gastrointestinal details normal Nontender   Lab Results: Routine Micro:  21-Jul-15 05:57   Micro Text Report HIV 1/2 AG AB COMBO   HIV 1/2 ANTIBODIES        NON-REACTIVE ANTIBODY   HIV-1 p24 ANTIGEN         NON-REACTIVE ANTIGEN   INTERPRETATION            NONREACTIVE.  A NONREACTIVE test result means that HIV-1 or HIV-2 antibodies and HIV-1 p24 antigen were not detected in the specimen.   ANTIBIOTIC                       Routine Chem:  21-Jul-15 14:08   Creatinine (comp)  1.45  eGFR (African American) >60  eGFR (Non-African American)  59 (eGFR values <65mL/min/1.73 m2 may be an indication of chronic kidney disease (CKD). Calculated eGFR is useful in patients with stable renal function. The eGFR calculation will not be reliable in acutely ill patients when serum creatinine is changing rapidly. It is not useful in  patients on dialysis. The eGFR calculation may not be applicable to patients at the low and high extremes of body sizes, pregnant women, and vegetarians.)  Routine Sero:  21-Jul-15 05:57   - HIV 1/2 Antibodies NON-REACTIVE ANTIBODY  - HIV-1 p24 Antigen NON-REACTIVE ANTIGEN  - Interpretation NONREACTIVE.  A NONREACTIVE test result means that HIV-1 or HIV-2 antibodies and HIV-1 p24 antigen were not detected in the specimen.  Result(s) reported on 18 Apr 2014 at 12:19PM.  Routine Coag:  21-Jul-15 05:57    Col/EPI 108 (0-150 Prolonged closure time for  the col/EPI screen test may  be seen in the following: Platelet dysfunction that is acquired, inherited, or  induced by platelet inhibiting agents, such as aspirin; HCT <35%, and  Platelet count <150,000. Suggest clinical correlation.)  Col/ADP  138 (Result(s) reported on 18 Apr 2014 at 07:38AM.)    14:22   Activated PTT (APTT)  37.1 (A HCT value >55% may artifactually increase the APTT. In one study, the increase was an average of 19%. Reference: "Effect on Routine and Special Coagulation Testing Values of Citrate Anticoagulant Adjustment in Patients with High HCT Values." American Journal of Clinical Pathology 2006;126:400-405.)  Col/EPI 101 (0-150 Prolonged closure time for  the col/EPI screen test may  be seen in the following: Platelet dysfunction that is acquired, inherited, or  induced by platelet inhibiting agents, such as aspirin; HCT <35%, and  Platelet count <150,000. Suggest clinical correlation.)  Col/ADP  112 (Result(s) reported on 18 Apr 2014 at 04:07PM.)  Routine Hem:  21-Jul-15 05:57   Hematocrit (CBC) 40.0 (Result(s) reported on 18 Apr 2014 at 06:43AM.)  Platelet Count (CBC)  135 (Result(s) reported on 18 Apr 2014 at 06:44AM.)    14:08   WBC (CBC) 9.6  RBC (CBC) 4.55  Hemoglobin (CBC) 14.3  Hematocrit (CBC)  41.4  Platelet Count (CBC)  144  MCV 91  MCH 31.4  MCHC 34.5  RDW 12.8  Neutrophil % 78.6  Lymphocyte % 8.9  Monocyte % 11.1  Eosinophil % 0.7  Basophil % 0.7  Neutrophil #  7.5  Lymphocyte #  0.9  Monocyte #  1.1  Eosinophil # 0.1  Basophil # 0.1 (Result(s) reported on 18 Apr 2014 at 02:43PM.)   Assessment/Plan:  Assessment/Plan:  Assessment SEE ALSO INITIAL CONSULT.....BRIEFLY, 1.  POSSIBLE UNDERLYING CYST OR LESION KIDNEY, AS ALREADY NOTED WILL NEED RENAL MRI IN 3 MO TO R/O MASS LESION.   2. POSSIBLE COAGULOPATHY, NOW HIGH PLTS FUNCTION WITH ADP, MILD ELEVATED PTT, SUGGESTIVE OF VON  WILLEBRANDS DISEASE. DEFICITIVE TESTING FOR VON WILLEBRANDS FACTORS AND MULTIMERS IS PENDING   3. 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 START IV FLUIDS.  FOLLOW CRE, HGB, WATCH FOR PAIN, IF SUSPICION OF REBLEED, THEN REPEAT NON CONTRAST CT AND GIVE DDAVP 0.3MICROGRAMS PER KG IV, THEN MAY REPEAT 12 TO 24 HRS LATER. FOLLOW UP THE PLT COUNT, AND HEP C SEROLOGY, REPEAT PTT. PRN   Electronic Signatures: Dallas Schimke (MD)  (Signed 21-Jul-15 17:14)  Authored: Chief Complaint, VITAL SIGNS/ANCILLARY NOTES, Brief Assessment, Lab Results, Assessment/Plan   Last Updated: 21-Jul-15 17:14 by Dallas Schimke (MD)

## 2015-01-20 NOTE — Consult Note (Signed)
Brief Consult Note: Diagnosis: PERINEPHRIC SUBCAPSULAR HEMATOMA.   Patient was seen by consultant.   Comments: PATIENT SEEN  DISCUSSED WITH DR WOLF. NO HX BLEEDING, THIS EPISODE UNPRECIPITATED. A DOSE OF TORADOL WAS GIVEN AFTER ADMISSION.  CLINICALLY STABLE, HCT STABLE.  BORDERLINE THROMBOCYTOPENIA , PT AND PTT NORMAL       SUGGEST  1. NO TORADOL OR ANY NSAIDS   2. IN AM CHECK PLT FUNCTION, VON WILLEBRANDS PROFILE  3, IF TESTS NEGATIVE, YTHEN ADDITIONAL INDIVIDUAL CLOTTING FACTORS 4. AS PER NEPHROLOGY, F/U IMAGING TO R/O RENAL TUMOR.  Electronic Signatures: Marin RobertsGittin, De Libman G (MD)  (Signed 20-Jul-15 18:51)  Authored: Brief Consult Note   Last Updated: 20-Jul-15 18:51 by Marin RobertsGittin, Alyaan Budzynski G (MD)

## 2015-01-20 NOTE — Consult Note (Signed)
Brief Consult Note: Diagnosis: Spontaneous left renal hematoma.   Patient was seen by consultant.   Consult note dictated.   Recommend further assessment or treatment.   Orders entered.   Discussed with Attending MD.   Comments: Bedrest with bathroom privaleges for 2 weeks. Hematocrit q8 hrs. CT this AM indicates that hematoma is not expanding. If stable overnight may be discharged home tomorrow. Follow-up in the office.  Electronic Signatures: Orson ApeWolff, Frieda Arnall R (MD)  (Signed 20-Jul-15 13:02)  Authored: Brief Consult Note   Last Updated: 20-Jul-15 13:02 by Orson ApeWolff, Jatniel Verastegui R (MD)

## 2015-01-28 IMAGING — CT CT ABDOMEN WO/W CM
2 of 10 series · 11 of 46 positions shown, 18 images · IV contrast (isovue)
Comparison: 04/16/2014.

CLINICAL DATA: Left perinephric hematoma with multiple complex
lesions on CT yesterday.

EXAM:
CT ABDOMEN WITHOUT AND WITH CONTRAST
TECHNIQUE: Multidetector CT imaging of the abdomen was performed following the
standard protocol before and following the bolus administration of
intravenous contrast.
CONTRAST:  100 cc Isovue 370.

[Series 6: renal venous · axial · portal-venous · 0.65mm/px · z∈[-805,-611]mm · 9 of 123 slices shown, 15 images]
[im 13/123  soft-tissue]
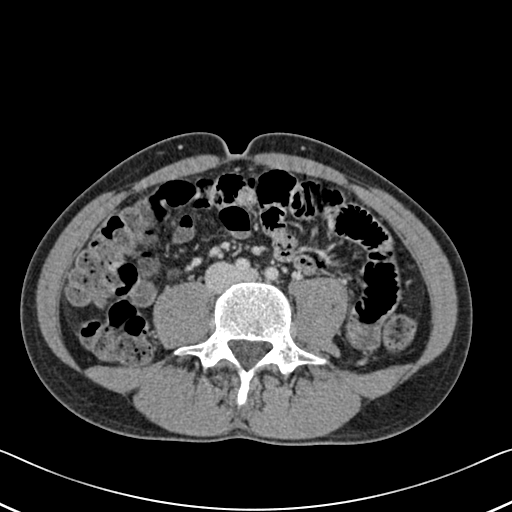
[im 13/123  bone]
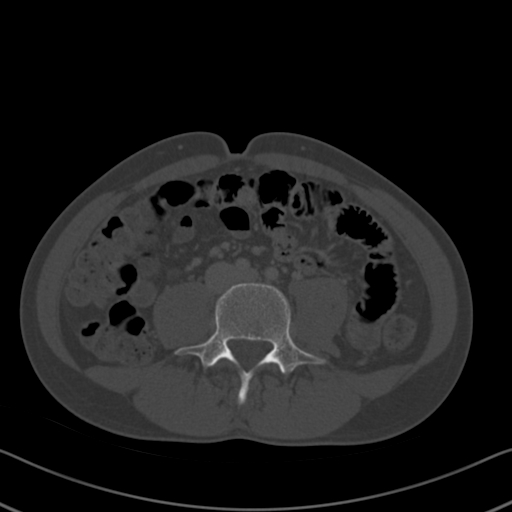
[im 25/123  soft-tissue]
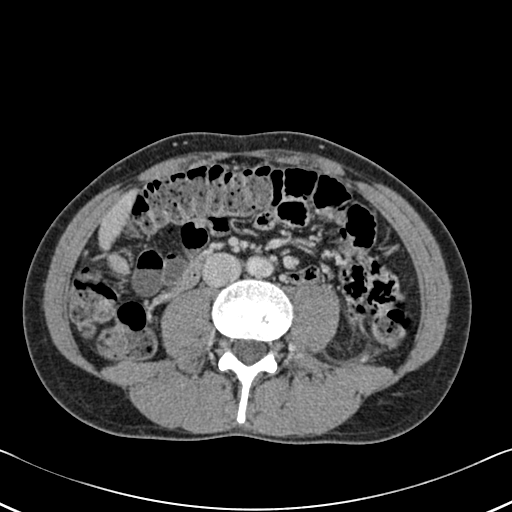
[im 37/123  soft-tissue]
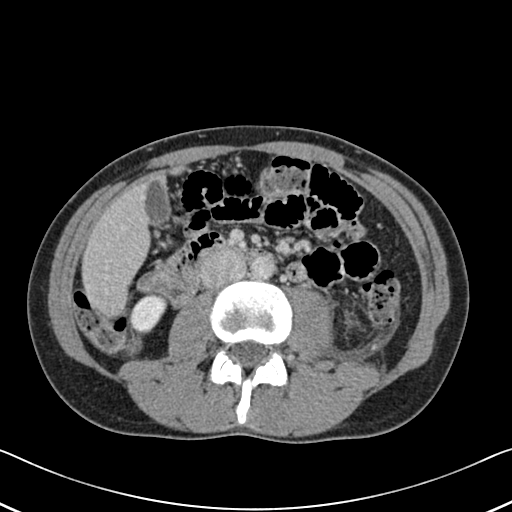
[im 49/123  soft-tissue]
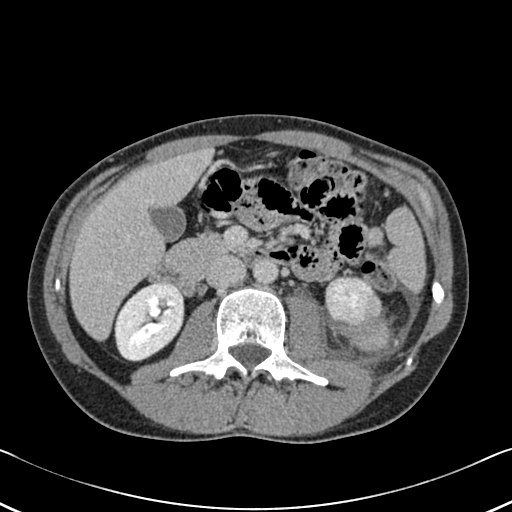
[im 62/123  soft-tissue]
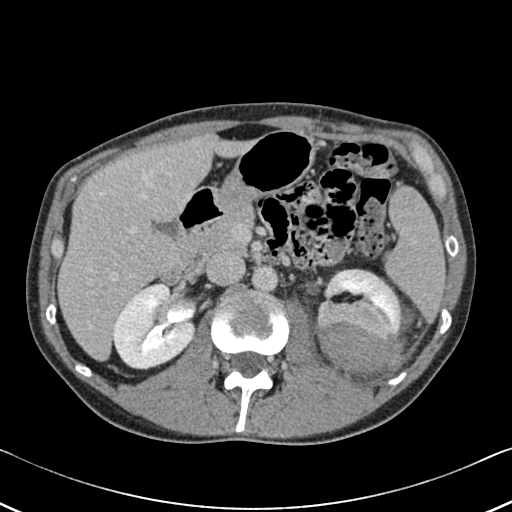
[im 74/123  soft-tissue]
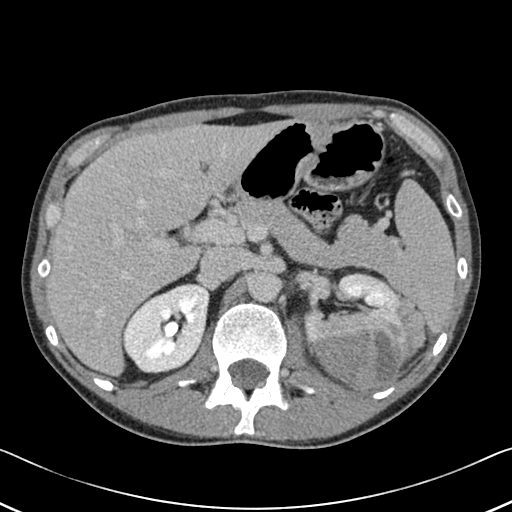
[im 74/123  lung]
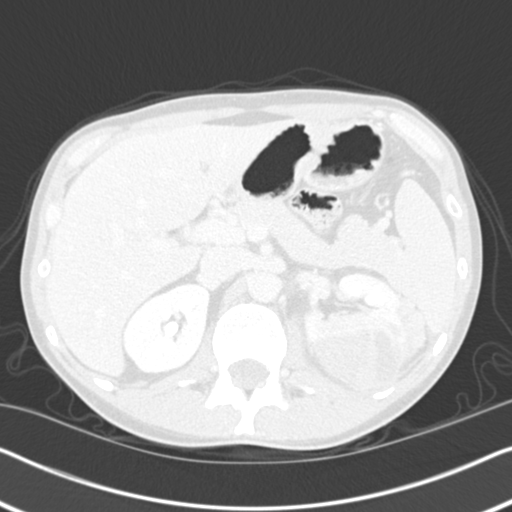
[im 86/123  soft-tissue]
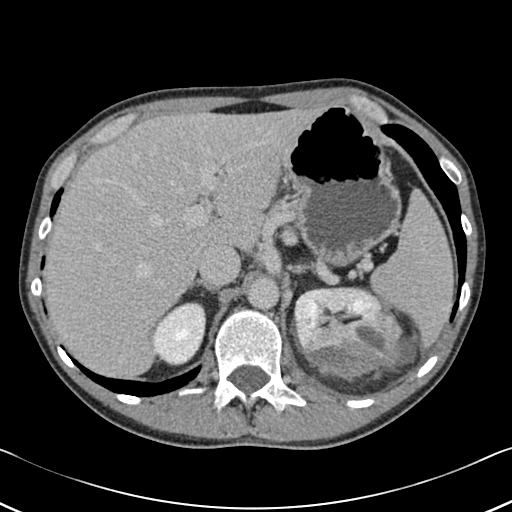
[im 86/123  lung]
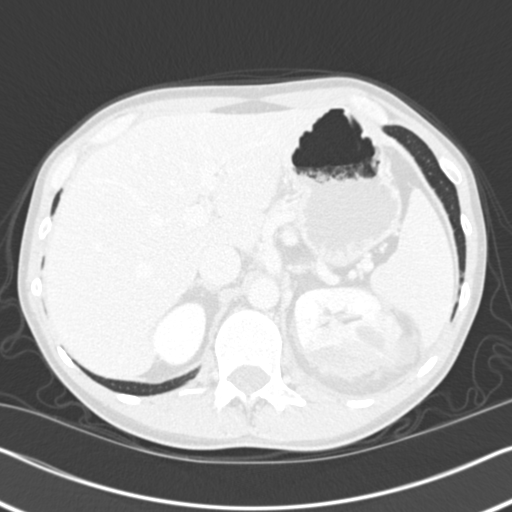
[im 98/123  soft-tissue]
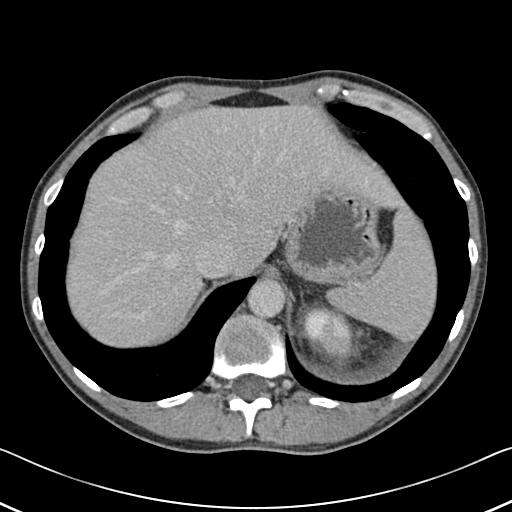
[im 98/123  lung]
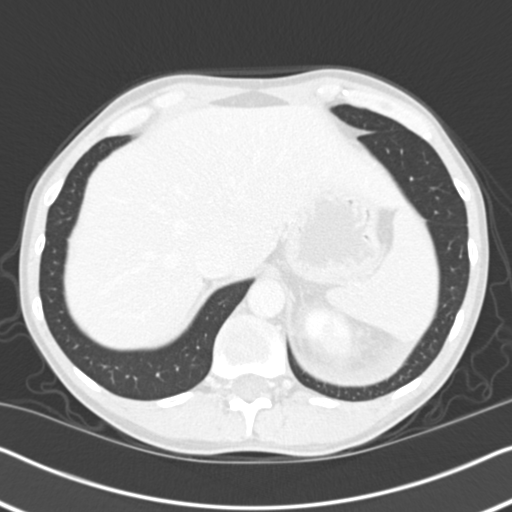
[im 110/123  soft-tissue]
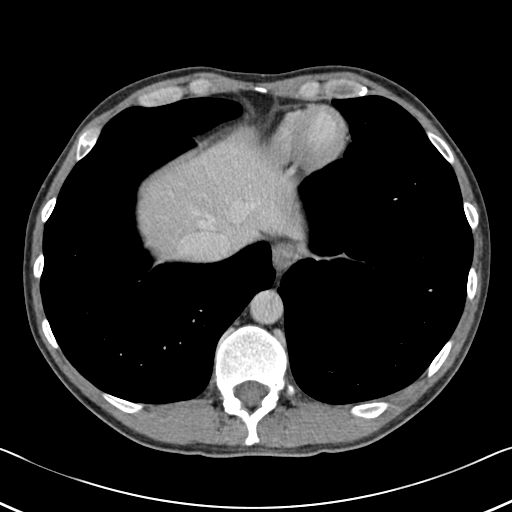
[im 110/123  lung]
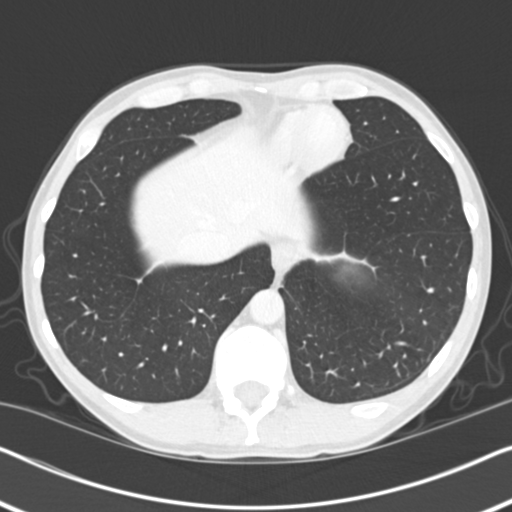
[im 110/123  bone]
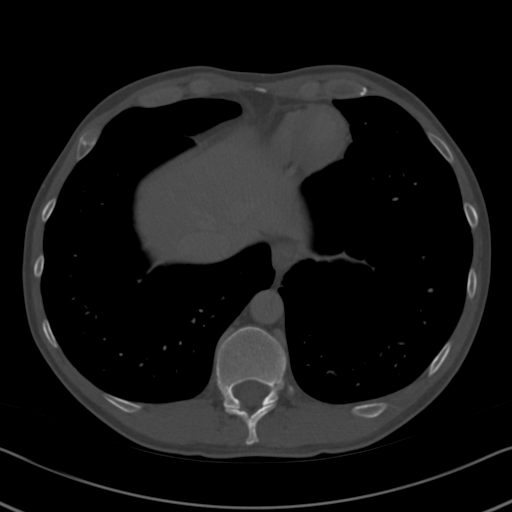

[Series 8: coronal renal without · coronal · non-contrast · 0.55mm/px · 2 of 129 slices shown, 3 images]
[im 43/129  soft-tissue]
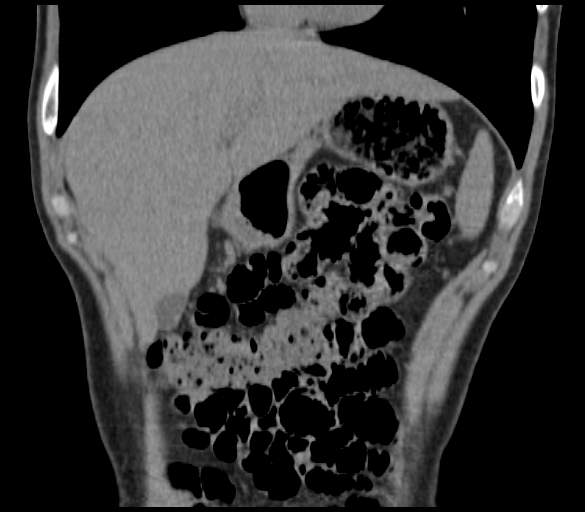
[im 43/129  bone]
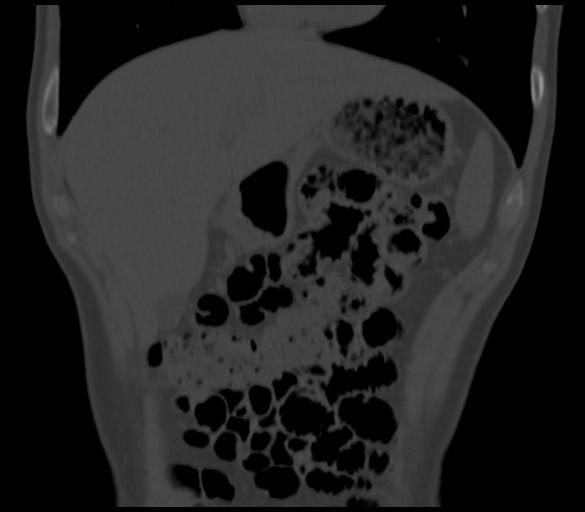
[im 86/129  soft-tissue]
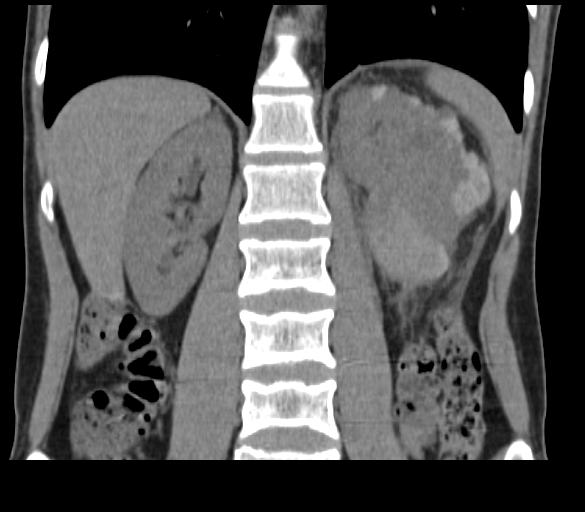

[11 of 46 positions shown; findings below may reference images not displayed]

FINDINGS: Lung bases show no acute findings. Heart size normal. No pericardial
or pleural effusion.

Liver, gallbladder, adrenal glands and right kidney are
unremarkable. As on yesterday's exam, there is a complex high and
low attenuation fluid collection along the posterior aspect of the
left kidney, measuring 3.8 x 5.8 cm, stable. Lesion appears to be
subcapsular, as there is mass effect on the adjacent renal
parenchyma additionally, there is heterogeneity of the renal
parenchyma on delayed nephrographic phase imaging and lack of
excretion of contrast from the left kidney. Several low and high
density lesions are seen within the left kidney which do not appear
to show contrast enhancement. Index hyperdense lesion off the upper
pole left kidney measures 2.1 cm (series 4, image 34). Inflammatory
stranding surrounds the left kidney.

Spleen, pancreas, stomach and visualized bowel are unremarkable. No
pathologically enlarged lymph nodes. No free fluid. No worrisome
lytic or sclerotic lesions.
IMPRESSION: 1. Left renal hematoma appears subcapsular in location with imaging
findings indicative of decreased left renal function. As indicated
yesterday, cause may be due to rupture of a hemorrhagic cyst.
2. Multiple high and low attenuation lesions in the left kidney
which do not show contrast enhancement. However, it is difficult to
exclude an occult lesion, and therefore malignancy, obscured by the
acute hematoma described earlier. Consider follow-up MR abdomen
without and with contrast in 3 months in further evaluation, as
clinically indicated.
